# Patient Record
Sex: Male | Born: 1965 | Race: White | Hispanic: No | Marital: Married | Smoking: Former smoker
Health system: Southern US, Community
[De-identification: ages and names within clinical notes are randomized; demographics above are authoritative.]

## PROBLEM LIST (undated history)

## (undated) DIAGNOSIS — E119 Type 2 diabetes mellitus without complications: Secondary | ICD-10-CM

## (undated) HISTORY — PX: COLONOSCOPY: SHX174

## (undated) HISTORY — PX: NECK SURGERY: SHX720

## (undated) HISTORY — PX: POLYPECTOMY: SHX149

---

## 1999-01-30 ENCOUNTER — Ambulatory Visit (HOSPITAL_COMMUNITY): Admission: RE | Admit: 1999-01-30 | Discharge: 1999-01-30 | Payer: Self-pay | Admitting: Obstetrics and Gynecology

## 1999-02-25 ENCOUNTER — Encounter: Payer: Self-pay | Admitting: Neurosurgery

## 1999-02-26 ENCOUNTER — Encounter: Payer: Self-pay | Admitting: Neurosurgery

## 1999-02-26 ENCOUNTER — Inpatient Hospital Stay (HOSPITAL_COMMUNITY): Admission: RE | Admit: 1999-02-26 | Discharge: 1999-02-27 | Payer: Self-pay | Admitting: Neurosurgery

## 2004-10-22 ENCOUNTER — Ambulatory Visit (HOSPITAL_COMMUNITY): Admission: RE | Admit: 2004-10-22 | Discharge: 2004-10-22 | Payer: Self-pay | Admitting: Family Medicine

## 2006-04-13 ENCOUNTER — Ambulatory Visit (HOSPITAL_COMMUNITY): Admission: RE | Admit: 2006-04-13 | Discharge: 2006-04-13 | Payer: Self-pay | Admitting: Urology

## 2006-06-23 ENCOUNTER — Ambulatory Visit (HOSPITAL_COMMUNITY): Admission: RE | Admit: 2006-06-23 | Discharge: 2006-06-23 | Payer: Self-pay | Admitting: Urology

## 2006-06-23 ENCOUNTER — Inpatient Hospital Stay (HOSPITAL_COMMUNITY): Admission: EM | Admit: 2006-06-23 | Discharge: 2006-06-24 | Payer: Self-pay | Admitting: Emergency Medicine

## 2006-06-25 ENCOUNTER — Inpatient Hospital Stay (HOSPITAL_COMMUNITY): Admission: EM | Admit: 2006-06-25 | Discharge: 2006-06-27 | Payer: Self-pay | Admitting: Emergency Medicine

## 2006-07-07 ENCOUNTER — Ambulatory Visit (HOSPITAL_COMMUNITY): Admission: RE | Admit: 2006-07-07 | Discharge: 2006-07-07 | Payer: Self-pay | Admitting: Urology

## 2006-07-12 ENCOUNTER — Ambulatory Visit (HOSPITAL_COMMUNITY): Admission: RE | Admit: 2006-07-12 | Discharge: 2006-07-12 | Payer: Self-pay | Admitting: Urology

## 2006-08-12 ENCOUNTER — Ambulatory Visit (HOSPITAL_COMMUNITY): Admission: RE | Admit: 2006-08-12 | Discharge: 2006-08-12 | Payer: Self-pay | Admitting: Urology

## 2007-02-03 ENCOUNTER — Ambulatory Visit (HOSPITAL_COMMUNITY): Admission: RE | Admit: 2007-02-03 | Discharge: 2007-02-03 | Payer: Self-pay | Admitting: Urology

## 2008-02-07 IMAGING — CR DG ABDOMEN 1V
1 series · 1 of 1 positions shown · non-contrast
Comparison: 04/13/06.

CLINICAL DATA: Follow-up right renal calculus.
 ABDOMEN ? 1 VIEW:

[view not recorded]
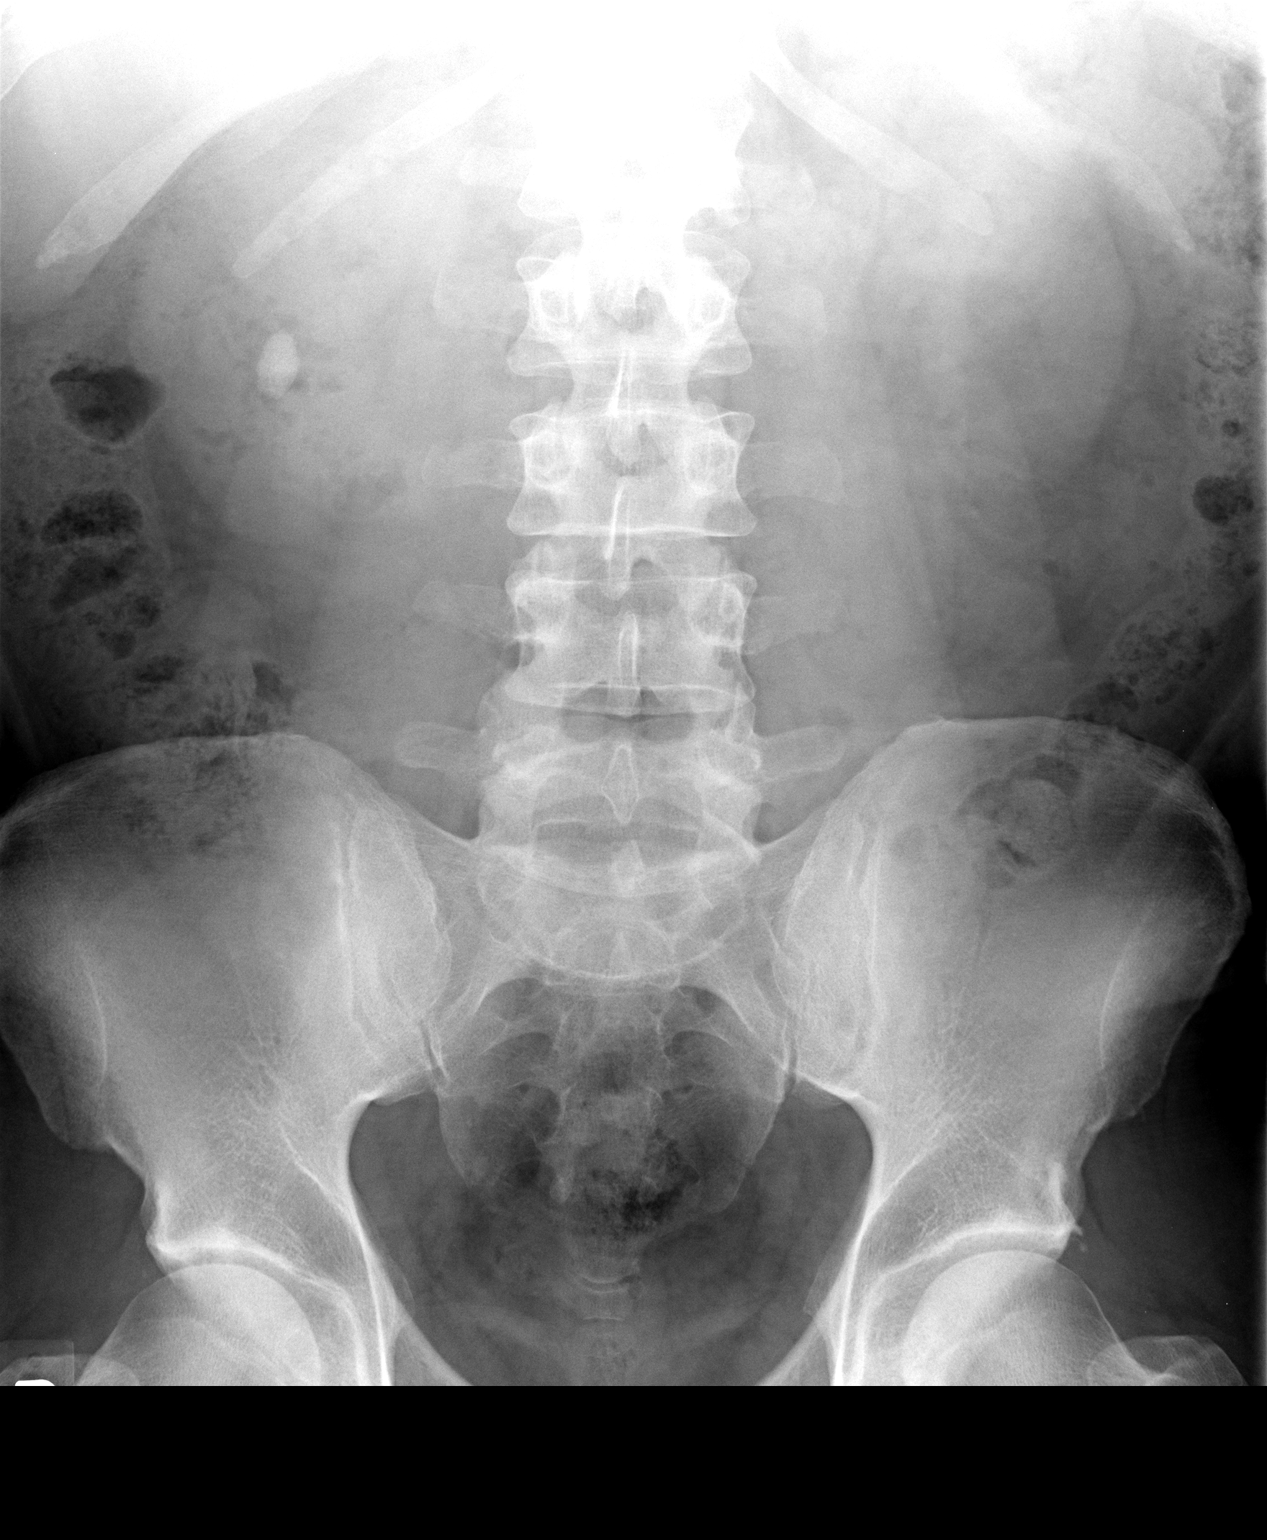

[1 of 1 positions shown; findings below may reference images not displayed]

FINDINGS: As seen on the prior exam, there is a 17.6 x 11.1 mm stone in the right renal lower pole collecting system.  This is unchanged compared with the prior exam.  No additional stones are identified.  The bowel gas pattern is normal.
IMPRESSION: No significant change in right renal calculus.

## 2010-02-10 ENCOUNTER — Ambulatory Visit: Payer: Self-pay | Admitting: Orthopedic Surgery

## 2010-02-10 DIAGNOSIS — M7710 Lateral epicondylitis, unspecified elbow: Secondary | ICD-10-CM | POA: Insufficient documentation

## 2010-06-04 ENCOUNTER — Ambulatory Visit: Payer: Self-pay | Admitting: Orthopedic Surgery

## 2010-06-05 ENCOUNTER — Encounter (INDEPENDENT_AMBULATORY_CARE_PROVIDER_SITE_OTHER): Payer: Self-pay | Admitting: *Deleted

## 2010-06-06 ENCOUNTER — Ambulatory Visit (HOSPITAL_COMMUNITY): Admission: RE | Admit: 2010-06-06 | Discharge: 2010-06-06 | Payer: Self-pay | Admitting: Orthopedic Surgery

## 2010-06-16 ENCOUNTER — Telehealth: Payer: Self-pay | Admitting: Orthopedic Surgery

## 2010-06-17 ENCOUNTER — Encounter: Payer: Self-pay | Admitting: Orthopedic Surgery

## 2010-06-30 ENCOUNTER — Encounter: Payer: Self-pay | Admitting: Orthopedic Surgery

## 2010-12-23 NOTE — Miscellaneous (Signed)
   The patient today regarding MRI MRI shows intrasubstance tear ECRB and severe lateral epicondylitis.  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

## 2010-12-23 NOTE — Letter (Signed)
Summary: *Orthopedic Consult Note  [facility]  [address]  [place], [place] [address]   Phone: [phone]  Fax: [phone]    Re:    Jeff Mccarthy DOB:    Nov 17, 1966   Dear: Jeff Mccarthy   Thank you for requesting that we see the above patient for consultation.  A copy of the detailed office note will be sent under separate cover, for your review.  Evaluation today is consistent with: posttraumatic lateral epicondylitis   Our recommendation is for: self-directed exercise program, cryotherapy, injection, tennis elbow brace.       Thank you for this opportunity to look after your patient.  Sincerely,   Terrance Mass. MD.

## 2010-12-23 NOTE — Letter (Signed)
Summary: [facility]  [address]  [place], [place] [address]   Phone: [phone]  Fax: [phone]    Today's Date: June 17, 2010  Name of Patient: Jeff Mccarthy   Please take the following into consideration:  Special Instructions:   [ X ] Other   Patient / Employee cannot take the PT test for 6 weeks.    Sincerely,   Terrance Mass, MD

## 2010-12-23 NOTE — Progress Notes (Signed)
Summary: call back from patient for MRI results  Phone Note Call from Patient   Caller: Patient Summary of Call: Patient called in, states he rec'd a call back from Dr Romeo Apple at [facility] ph# about MRI results.  States was out of town all last wk. Please call Cell PH # [phone] with results. Initial call taken by: Cammie Sickle,  June 16, 2010 11:27 AM

## 2010-12-23 NOTE — Assessment & Plan Note (Signed)
Summary: LEFT ELBOW HURTING AGAIN/XR HERE 02/10/10/[facility]   Visit Type:  Follow-up Referring Provider:  Dr. Regino Schultze Primary Provider:  [facility]  CC:  left elbow.  History of Present Illness: followup visit status post injured her LEFT elbow patient was also treated for traumatic injury to LEFT elbow as well as tennis elbow with Naprosyn, injection and tennis elbow brace  Initially got some relief from the brace the pain seems to be worsening over the lateral elbow  Pain never really went away completely  No new injury  not on  a medication right now  Initial injury was in March of 2011   initial history 45 year old male LEFT elbow pain after curling with a concentration curl maneuver.  No pop was felt but the next day had pain lateral elbow which is increased over a month.  It still sometime sharp is intermittent it is worse with power extension maneuvers somewhat relieved with ice and naproxen.  Pain level is a 6/10  The above injury was in March of this year.  The pain never went away.    Was given Naprosyn 500 two times a day, injection and tennis elbow brace, brace helped, injection did not help.       Allergies: No Known Drug Allergies  Past History:  Past Medical History: Last updated: 02/10/2010 na  Past Surgical History: Last updated: 02/10/2010 lasik bilateral eyes fusion of c5-c6  Physical Exam  Additional Exam:  General appearance was normal muscular build  ** CDV: normal pulses temperature and no edema  * LYMPH nodes were normal   * SKIN was normal   * Neuro: normal sensation ** Psyche: AAO x 3 and mood was normal   MSK *Gait was normal  inspection tenderness over the lateral elbow, normal range of motion elbow, stability normal, motor exam normal, provocative test positive pain with extension of the wrist pain referred to the lateral elbow   Impression & Recommendations:  Problem # 1:  LATERAL EPICONDYLITIS  (ICD-726.32) Assessment Deteriorated  previous treatments of nonoperative therapy including 2 injections one given today has not relieved his pain recommend MRI to confirm diagnosis and recommend further treatment  Orders: Est. Patient Level III (38756) Joint Aspirate / Injection, Intermediate (20605) Depo- Medrol 40mg  (J1030)  Medications Added to Medication List This Visit: 1)  Diclofenac Sodium 50 Mg Tbec (Diclofenac sodium) .... One by mouth bid  Patient Instructions: 1)  Change medication to diclofenac  2)  Continue brace  3)  Order MRI elbow  4)  Continue elbow exercises  Prescriptions: DICLOFENAC SODIUM 50 MG TBEC (DICLOFENAC SODIUM) one by mouth bid  #60 x 1   Entered and Authorized by:   Fuller Canada MD   Signed by:   Fuller Canada MD on 06/04/2010   Method used:   Faxed to ...       [facility]* (retail)       [address]       [place], [place]  [address]       Ph: [phone] or [phone]       Fax: [phone]   RxID:   [id number]

## 2010-12-23 NOTE — Letter (Signed)
Summary: History form  History form   Imported By: Jacklynn Ganong 02/12/2010 12:06:48  _____________________________________________________________________  External Attachment:    Type:   Image     Comment:   External Document

## 2010-12-23 NOTE — Assessment & Plan Note (Signed)
Summary: CONSULT/TREAT LT ELBOW/NEEDS XRAY/REF [facility]   Vital Signs:  Patient profile:   45 year old male Height:      70 inches Weight:      235 pounds Pulse rate:   74 / minute Resp:     16 per minute  Visit Type:  Initial Consult Referring Provider:  Dr. Regino Schultze Primary Provider:  [facility]  CC:  left elbow.  History of Present Illness: 45 year old male LEFT elbow pain after curling with a concentration curl maneuver.  No pop was felt but the next day had pain lateral elbow which is increased over a month.  It still sometime sharp is intermittent it is worse with power extension maneuvers somewhat relieved with ice and naproxen.  Pain level is a 6/10  Will have xrays today.  No meds.      Allergies (verified): No Known Drug Allergies  Past History:  Past Medical History: na  Past Surgical History: lasik bilateral eyes fusion of c5-c6  Family History: na  Social History: Patient is married.  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 Cardiovascular:  Denies chest pain, palpitations, fainting, and murmurs. Respiratory:  Denies short of breath, wheezing, couch, tightness, pain on inspiration, and snoring . Gastrointestinal:  Denies heartburn, nausea, vomiting, diarrhea, constipation, and blood in your stools. Genitourinary:  Denies frequency, urgency, difficulty urinating, painful urination, flank pain, and bleeding in urine. Neurologic:  Denies numbness, tingling, unsteady gait, dizziness, tremors, and seizure. Musculoskeletal:  Complains of joint pain and stiffness; denies swelling, instability, redness, heat, and muscle pain. Endocrine:  Denies excessive thirst, exessive urination, and heat or cold intolerance. Psychiatric:  Denies nervousness, depression, anxiety, and hallucinations. Skin:  Denies  changes in the skin, poor healing, rash, itching, and redness. HEENT:  Denies blurred or double vision, eye pain, redness, and watering. Immunology:  Denies seasonal allergies, sinus problems, and allergic to bee stings. Hemoatologic:  Denies easy bleeding and brusing.  Physical Exam  Additional Exam:  vital signs stable as recorded.  Gen. appearance normal.  Pulses in the LEFT arm normal no swelling  No lymph nodes LEFT arm  He's tender over the lateral epicondyle radial head.  Range of motion is full pain with extension.  Elbow stable.  Muscle strength normal.  Pain with extension against resistance.  kin normal.  Sensation normal.  Orientation to time person place normal.  Mood and affect normal.   Impression & Recommendations:  Problem # 1:  LATERAL EPICONDYLITIS (ICD-726.32) Assessment New  ordered LEFT elbow x-rays,AP lateral LEFT elbow  Bony anatomy is normal  Joint spaces are preserved  Impression normal elbow  Recommend injection LEFT elbow The  elbow was prepped with alcohol and anesthetized with ethyl chloride. 40 mg of Depo-Medrol and 5 cc of 1% lidocaine were injected along the lateral epicondyle. No complications. Verbal consent was obtained prior to injection.   Tennis elbow brace  Stretch and strengthening program for 6 weeks  Continue naproxen for 6 weeks  Orders: Consultation Level III (25366) Joint Aspirate / Injection, Intermediate (20605) Depo- Medrol 40mg  (J1030) Elbow x-ray, 2 views (44034)  Patient Instructions: 1)  Tennis elbow brace 2)  Stretch and strengthening program for 6 weeks 3)  Continue naproxen for 6 weeks 4)  Please schedule a follow-up appointment as needed. 5)  You have received an injection of cortisone today. You may experience increased pain  at the injection site. Apply ice pack to the area for 20 minutes every 2 hours and take 2 xtra strength tylenol every 8 hours. This increased pain will usually resolve in 24 hours. The  injection will take effect in 3-10 days.

## 2010-12-23 NOTE — Miscellaneous (Signed)
Summary: mri aph 06/06/10 reg 430pm  Clinical Lists Changes  o precert needed for [facility], advised pt of appt, dr H will call with results.cbt

## 2010-12-23 NOTE — Letter (Signed)
Summary: [facility]  [address]  [place], [place] [address]   Phone: [phone]  Fax: [phone]      Today's Date: June 30, 2010  Name of Patient: Jeff Mccarthy  Please take the following into consideration:   Special Instructions:  Due to medical reasons --  Patient / Employee cannot take the PT test for 6 weeks - through August 11, 2010. ________________________________________________________________________________________________________________________________________   Sincerely,   Terrance Mass, MD

## 2011-04-10 NOTE — H&P (Signed)
NAME:  Jeff Mccarthy, Jeff Mccarthy NO.:  [id number]   MEDICAL RECORD NO.:  [id number]          PATIENT TYPE:  AMB   LOCATION:  DAY                           FACILITY:  [facility]   PHYSICIAN:  Dennie Maizes, M.D.   DATE OF BIRTH:  1966/06/26   DATE OF ADMISSION:  06/23/2006  DATE OF DISCHARGE:  LH                                HISTORY & PHYSICAL   CHIEF COMPLAINT:  Right flank pain, large right renal calculus.   HISTORY OF PRESENT ILLNESS:  This 45 year old male is referred to me by Dr.  Regino Schultze.  He had intermittent right flank pain for a few months.  He was  evaluated with noncontrast CT scan of the abdomen and pelvis, as well as KUB  area.  This revealed a large stone, about 12 x 10 mm in size, in the right  renal pelvis.  Patient denied having any fever, chills, hematuria or voiding  difficulty.  He has urinary frequency times three to four and nocturia times  one to two.  There is no past history of urolithiasis.   PAST MEDICAL HISTORY:  Cervical spine surgery, fusion of C5-C6, sinus  problems.   MEDICATIONS:  Flonase and Clarinex.   ALLERGIES:  NONE.   EXAMINATION:  Height 5 feet 10, weight 231 pounds.  Head, eyes, ears, nose and throat normal.  NECK:  No masses.  LUNGS:  Clear to auscultation.  HEART:  Regular rate and rhythm, no murmurs.  ABDOMEN:  Soft, no palpable flank mass, mild right costovertebral angle  tenderness is noted.  Penis and testes are normal.   IMPRESSION:  Large right renal calculus with right flank pain.   PLAN:  I have discussed with the patient regarding the management options.  The right renal calculus is too large to pass.  I recommended lithotripsy of  the calculus and the patient is agreeable.  He is scheduled to undergo  extracorporeal shock wave lithotripsy of the right renal calculus with IV  sedation at [facility].  I discussed with the patient regarding the  diagnosis, operative details, alternative treatments, outcome,  possible  risks and complications, and he has agreed for the procedure to be done.      Dennie Maizes, M.D.  Electronically Signed     SK/MEDQ  D:  06/23/2006  T:  06/23/2006  Job:  [id number]   cc:   Kirk Ruths, M.D.  Fax: [phone]

## 2011-04-10 NOTE — Op Note (Signed)
NAME:  Jeff Mccarthy, OLD NO.:  [id number]   MEDICAL RECORD NO.:  [id number]          PATIENT TYPE:  INP   LOCATION:  A304                          FACILITY:  [facility]   PHYSICIAN:  Dennie Maizes, M.D.   DATE OF BIRTH:  [birth year]   DATE OF PROCEDURE:  06/24/2006  DATE OF DISCHARGE:                                 OPERATIVE REPORT   PREOPERATIVE DIAGNOSES:  Right ureteral calculi with obstruction, right  renal colic, post ESL right renal calculus.   POSTOPERATIVE DIAGNOSES:  Right ureteral calculi with obstruction, right  renal colic, post ESL right renal calculus.   OPERATIVE PROCEDURE:  Cystoscopy, right retrograde pyelogram and right  ureteral stent placement.   ANESTHESIA:  General.   SURGEON:  Dennie Maizes, M.D.   COMPLICATIONS:  None.   DRAINS:  6-French 26 cm size right ureteral stent.   SPECIMEN:  None.   INDICATIONS FOR PROCEDURE:  This 45 year old male had a large right renal  calculus.  He has undergone ESL of the right renal calculus on June 23, 2006. He developed severe right flank pain, nausea and vomiting in the  postoperative period.  He was admitted to the hospital for further  evaluation and management.  X-rays revealed large upper ureteral stone  fragments as well as lower ureteral stone fragments.  The patient was taken  to the operating room today for cystoscopy, right retrograde pyelogram and  right ureteral stent placement.   DESCRIPTION OF PROCEDURE:  General anesthesia was induced and the patient  was placed on the OR table in the dorsolithotomy position.  The lower  abdomen and genitalia were prepped and draped in a sterile fashion.  Cystoscopy was done with a 25-French scope.  The appearance of the urethra,  bladder and prostate were normal.  A 5-French  wedge  catheter was placed in  the right  ureteral orifice and retrograde pyelogram was done.  There were  large filling defects in the upper ureter.  There was also  hydronephrosis.   A 5-French open-ended catheter was then placed in the right ureteral  orifice.  A 0.038 inch Bentson guidewire with a flexible tip was then  advanced into the right renal pelvis without any difficulty.  The open-ended  catheter was then removed.  A 6-French 26 cm size stent was then inserted  into the right collecting system.  The cystoscope was removed.  The patient  was transferred to the PACU in satisfactory condition.      Dennie Maizes, M.D.  Electronically Signed     SK/MEDQ  D:  06/24/2006  T:  06/24/2006  Job:  [id number]   cc:   Kirk Ruths, M.D.  Fax: [phone]

## 2011-04-10 NOTE — H&P (Signed)
NAME:  MARLYN, RABINE NO.:  [id number]   MEDICAL RECORD NO.:  [id number]          PATIENT TYPE:  INP   LOCATION:  A325                          FACILITY:  [facility]   PHYSICIAN:  Ky Barban, M.D.DATE OF BIRTH:  1966/01/22   DATE OF ADMISSION:  06/25/2006  DATE OF DISCHARGE:  LH                                HISTORY & PHYSICAL   CHIEF COMPLAINT:  Right renal colic.   HISTORY:  A 45 year old gentleman had ESL done a couple of days ago for  right ureteral calculus.  Subsequently, he developed renal colic and a  double-J stent was placed by Dr. Rito Ehrlich yesterday.  He was doing fine and  started to have severe pain about 4 hours ago today.  He came to the  emergency room where he was medicated and pain was brought into control, but  he was still having pain, so we decided to admit him to control his pain.  No fever, chills, hematuria or any voiding difficulty.  He had some nausea  and vomiting.   PAST MEDICAL HISTORY:  1. History of having kidney stone in the past.  2. No diabetes or hypertension.   PERSONAL HISTORY:  Does not smoke or drink.   REVIEW OF SYMPTOMS:  Unremarkable.   EXAMINATION:  VITAL SIGNS:  Blood pressure is 125/80.  Temperature is  normal.  CENTRAL NERVOUS SYSTEM:  Negative.  HEAD, NECK AND ENT:  Negative.  CHEST:  Symmetrical.  HEART:  Regular sinus rhythm.  No murmur.  ABDOMEN:  Soft, flat.  Liver, spleen and kidneys are not palpable.  No CVA  tenderness.  RECTAL AND GENITALIA:  Unremarkable.  Rectal exam deferred.  EXTREMITIES:  Normal.   IMPRESSION:  Right renal colic.   PLAN:  IV fluids, parenteral analgesia and encourage oral fluid intake.  I  told him if he is feeling fine I will discharge him home in the morning.     Ky Barban, M.D.  Electronically Signed    MIJ/MEDQ  D:  06/25/2006  T:  06/26/2006  Job:  [id number]

## 2011-04-10 NOTE — H&P (Signed)
NAME:  Jeff Mccarthy, Jeff Mccarthy NO.:  [id number]   MEDICAL RECORD NO.:  [id number]          PATIENT TYPE:  AMB   LOCATION:  DAY                           FACILITY:  [facility]   PHYSICIAN:  Dennie Maizes, M.D.   DATE OF BIRTH:  [birth year]   DATE OF ADMISSION:  DATE OF DISCHARGE:  LH                                HISTORY & PHYSICAL   CHIEF COMPLAINT:  Right flank pain, right ureteral stone fragments, post  ESWL right renal calculus.   HISTORY OF PRESENT ILLNESS:  This 45 year old male was referred to me by Dr.  Regino Schultze.  He had intermittent right flank pain for a few months.  He was  evaluated with a noncontrast CT scan of the abdomen and pelvis as well as an  x-ray of the KUB area.  These x-rays revealed a large stone about 12 or 10  mm in size in the right renal pelvis.  The patient has undergone  extracorporeal shock wave lithotripsy of stone on June 23, 2006.  The stone  has fragmented well.  The patient was admitted to the hospital with severe  pain after the ESWL.  He had a large stone fragment in the proximal as well  as distal ureter.  He has undergone cystoscopy, right retrograde pyelogram,  and right ureteral stent placement.  The patient has good pain relief at  present.  He did not have any fever, chills, voiding difficulty, or gross  hematuria.  He is brought to the short stay center for cystoscopy, removal  of right ureteral stent, right retrograde pyelogram, ureteroscopy, stone  extraction, and stent placement.   PAST MEDICAL HISTORY:  1. Cervical spine surgery, fusion of C5-C6.  2. Sinus problem.  3. Status post ESWL of right renal calculus on July 12, 2006.   MEDICATIONS:  1. Flonase.  2. Clarinex.   ALLERGIES:  None.   EXAMINATION:  VITAL SIGNS:  Height 5 feet 10 inches, weight 231 pounds.  HEENT:  Normal.  NECK:  No masses.  LUNGS:  Clear to auscultation.  HEART:  Regular rate and rhythm.  No murmurs.  ABDOMEN:  Soft.  No palpable flank mass or  costovertebral angle tenderness.  Bladder not palpable.  GENITOURINARY:  Penis and testes are normal.   IMPRESSION:  Post extracorporeal shock wave lithotripsy right renal  calculus, right ureteral stone fragments, with obstruction.  Right renal  colic.   PLAN:  Cystoscopy, removal of right ureteral stent, right retrograde  pyelogram, ureteroscopy, stone extraction, and stent placement under  anesthesia in the short stay center.  I have discussed with the patient  regarding the diagnosis,  operative details, alternative treatments, outcome, possible risks, and  complications, and he has agreed for the procedure to be done.  Migration of  the stone fragments into the renal pelvis during the procedure was explained  to the patient.      Dennie Maizes, M.D.  Electronically Signed     SK/MEDQ  D:  07/11/2006  T:  07/11/2006  Job:  [id number]   cc:   Kirk Ruths, M.D.  Fax: [phone]

## 2011-04-10 NOTE — Op Note (Signed)
NAME:  Jeff Mccarthy, Jeff Mccarthy NO.:  [id number]   MEDICAL RECORD NO.:  [id number]          PATIENT TYPE:  AMB   LOCATION:  DAY                           FACILITY:  [facility]   PHYSICIAN:  Dennie Maizes, M.D.   DATE OF BIRTH:  11/10/1966   DATE OF PROCEDURE:  07/12/2006  DATE OF DISCHARGE:                                 OPERATIVE REPORT   PREOPERATIVE DIAGNOSIS:  Right upper ureteral calculi with obstruction,  status post right ureteral stent placement, post extracorporeal shock wave  lithotripsy right renal calculus.   POSTOPERATIVE DIAGNOSIS:  Right upper ureteral calculi with obstruction,  status post right ureteral stent placement, post extracorporeal shock wave  lithotripsy right renal calculus.   OPERATIVE PROCEDURE:  Cystoscopy, removal of right ureteral stent, right  retrograde pyelogram, right ureteroscopy, stone extraction, right ureteral  stent placement.   ANESTHESIA:  General.   SURGEON:  Dennie Maizes, M.D.   COMPLICATIONS:  None.   ESTIMATED:  Blood loss minimal.  Drains 6.26 cm size stent with a string in  the right ureter.   INDICATIONS FOR PROCEDURE:  This 45 year old male has undergone ESL of large  right renal calculus 2 weeks ago.  He was admitted to hospital with a large  right upper ureteral calculi with obstruction.  Cystoscopy, right ureteral  stent placement were done.  The patient was brought to the short-stay center  today for cystoscopy, right retrograde pyelogram, ureteroscopy, stone  extraction and stent placement.   DESCRIPTION OF PROCEDURE:  General anesthesia was induced and the patient  was placed on the OR table in the dorsal lithotomy position.  The lower  abdomen and genitalia were prepped and draped in a sterile fashion.  Cystoscopy was done with a 25-French scope.  Urethra and prostate were  normal.  There is lot of edema around the right ureteral orifice.  Right  ureteral stent was then removed with the biopsy forceps.   A 5-French French catheter was then placed in the right ureteral orifice.  About 7 mL of Renografin-60 was injected into the collecting system and a  retrograde pyelogram was done.  This revealed multiple large filling defects  in the upper ureter suggestive of stone fragments.  There was mild right  hydronephrosis.   Through an open ended catheter 0.038 inch Bentson guidewire was then  inserted in the right collecting system.  A 35 cm size ureteral access  sheath was inserted in the right ureter.  Safety guidewire was inserted into  the right renal pelvis.  Along the guidewire a 6 French flexible  ureteroscope was inserted into the right collecting system.  Several stone  fragments were seen in the upper ureter.  The largest stone fragment was  about 5 or 6 mm in size.  With a nitinol wire basket, the stone fragments  were trapped and retrieved.  One of the stone fragments moved up into the  renal pelvis and could not be retrieved.  There were also some stone  fragments in the distal ureter which were removed.  Ureteral access sheath  was then removed.  A 6 French 36 cm size 10 with a string was inserted in  the right collecting system.  The patient was then transferred to the PACU  in satisfactory condition.   A 60 was injected to the collecting system and retrograde pyelogram was  done.  This revealed multiple large filling defect in the upper ureter  suggestive of stone fragments.  There was mild right hydronephrosis.   A wire through open-ended catheter 0.138-gauge Bentson guidewire was then  inserted to the right collecting system.  The 35 cm size ureteral access  sheath was inserted to the right ureter.  A safety guidewire was then  inserted to the right renal pelvis.  Along the guidewire a 6-French flexible  ureteroscope was then inserted into the right collecting system.  Several  stone fragments were seen in the upper ureter.  The large stone fragment  measured about five a 6  mm in size.  The nitinol wire basket the stone  fragments were tract and retrieved.  Was stone fragments or duct into the  renal pelvis and could not be retrieved.  There also some stone fragment.  The distal ureter which were removed.  The ureteral access sheath was then  removed.  A 6.26 cm size stent with a string was inserted.  The right  collecting system.  The patient was then transferred to the PACU in a  satisfactory condition in a 6.26 cm size stent was inserted to the right  collecting system.  Copy to Dr. Molli Hazard in the dictation, dictated by fit  the patient thank you in the      Dennie Maizes, M.D.  Electronically Signed     SK/MEDQ  D:  07/12/2006  T:  07/12/2006  Job:  [id number]   cc:   Kirk Ruths, M.D.  Fax: [phone]

## 2011-04-10 NOTE — Discharge Summary (Signed)
NAME:  Jeff Mccarthy, PRIES NO.:  [id number]   MEDICAL RECORD NO.:  [id number]          PATIENT TYPE:  INP   LOCATION:  A325                          FACILITY:  [facility]   PHYSICIAN:  Ky Barban, M.D.DATE OF BIRTH:  [birth year]   DATE OF ADMISSION:  06/25/2006  DATE OF DISCHARGE:  08/05/2007LH                                 DISCHARGE SUMMARY   HISTORY:  [age]-year-old gentleman came to the emergency room with  right renal colic.  The patient had ESL done for right kidney stone a couple  of days ago and subsequently had developed renal colic.  Dr. Rito Ehrlich put a  double J stent in yesterday.  The patient was doing fine but started in  severe pain again.  He came to the emergency room and decided to admit him  for further management of pain.  He was started on IV fluid and parenteral  analgesia.   Lab workup showed WBC count was 8.8, hematocrit 45.6.  Sodium 140, potassium  3.8, chloride 100, CO2 32, glucose 109, BUN 13, creatinine 1.4.  Urine does  show positive nitrite.  Urine culture subsequently came back negative.  He  was started on IV fluids and parenteral analgesia and the pain became less,  but it took a couple of days before he felt comfortable enough to go home.  So, he was discharged home on August 5.  He will be followed up by Dr.  Rito Ehrlich in the office.   FINAL DISCHARGE DIAGNOSIS:  Right renal colic, post extracorporeal shock  wave lithotripsy and double J stent on that side.   I have told him if he has any pain or fever, to let me know.      Ky Barban, M.D.  Electronically Signed     MIJ/MEDQ  D:  07/14/2006  T:  07/15/2006  Job:  [id number]

## 2011-04-10 NOTE — H&P (Signed)
NAME:  Jeff Mccarthy, Jeff Mccarthy NO.:  [id number]   MEDICAL RECORD NO.:  [id number]          PATIENT TYPE:  INP   LOCATION:  A304                          FACILITY:  [facility]   PHYSICIAN:  Dennie Maizes, M.D.   DATE OF BIRTH:  [birth year]   DATE OF ADMISSION:  06/23/2006  DATE OF DISCHARGE:  LH                                HISTORY & PHYSICAL   CHIEF COMPLAINT:  Severe right flank pain, nausea, post ESL, large right  renal calculus.   HISTORY OF PRESENT ILLNESS:  This 45 year old male was referred to me by Dr.  Ewing Schlein.  He had right flank pain for a few months.  Evaluation revealed a  large right renal calculus, measuring about 12 x 10-mm in size.  This stone  is located in the right renal pelvis.  The patient has undergone ESL of the  right renal calculus on June 23, 2006.  He was discharged and sent home.  He experienced severe pain, which was not adequately controlled with p.o.  analgesics.  He also had intermittent nausea and vomiting.  He came to the  emergency room for further evaluation.  An x-ray of the area revealed good  fragmentation of the stone.  Multiple  stone fragments were noted in the  right kidney.  There were 5 or 6 stone fragments in the upper ureter, and a  small stone was noted in the right distal ureter.  The patient's pain was  not adequately controlled in the emergency room.  He was admitted to the  hospital for pain control and further treatment.   PAST MEDICAL HISTORY:  Cervical spine surgery, fusion of C5-6, sinus  problems, status post ESL of large right renal calculus on June 23, 2006.   MEDICATIONS:  Flonase and Clarinex, Cipro, Percocet.   ALLERGIES:  None.   PHYSICAL EXAMINATION:  GENERAL:  Height 5' 10, weight 231 pounds.  The  patient is in severe pain.  HEAD, EYES, EARS, NOSE, AND THROAT:  Normal.  NECK:  No masses.  LUNGS:  Clear to auscultation.  HEART:  Regular rate and rhythm.  No murmurs.  ABDOMEN:  Soft.  No palpable flank  mass.  Mild right costovertebral angle  tenderness is noted.  PENIS AND TESTES:  Normal.   IMPRESSION:  Post ESL, right renal calculus, right upper ureteral stone  fragments with obstruction, right renal colic.   PLAN:  Admit the patient to the hospital for IV fluids, treat pain with  narcotics, and antiemetics.  Discuss with the patient regarding further  treatment.  He may need cystoscopy, right retrograde pyelogram, and right  uretal stent placement.  I have discussed with the patient and his wife  regarding the diagnosis, operative details, alternative treatments,  outcomes, possible risks and complications, and he has agreed for the  procedure to be done.      Dennie Maizes, M.D.  Electronically Signed     SK/MEDQ  D:  06/24/2006  T:  06/24/2006  Job:  [id number]   cc:   Petra Kuba, M.D.  Fax: [phone]

## 2011-04-10 NOTE — Discharge Summary (Signed)
NAME:  Jeff Mccarthy, Jeff Mccarthy NO.:  [id number]   MEDICAL RECORD NO.:  [id number]          PATIENT TYPE:  INP   LOCATION:  A304                          FACILITY:  [facility]   PHYSICIAN:  Dennie Maizes, M.D.   DATE OF BIRTH:  07-03-1966   DATE OF ADMISSION:  06/23/2006  DATE OF DISCHARGE:  08/02/2007LH                                 DISCHARGE SUMMARY   FINAL DIAGNOSIS:  Large right renal calculus, post ESL, right ureteral stone  with obstruction, right renal colic.   OPERATIVE PROCEDURE:  Cystoscopy, retrograde pyelogram, right ureteral stent  placement done on 06/24/2006.  Complications none.   DISCHARGE SUMMARY:  This 45 year old male was referred to me by Dr.  Regino Schultze.  He had  right flank pain for a few months.  Evaluation revealed a  large right renal calculus measuring 12 by 10 mm in size.  The stone was  located by the right renal pelvis.  The patient had underwent  ESL right  renal calculus in August1,2007.  He was discharged and sent home.  He  experienced  severe pain which was not adequately controlled with p.o.  analgesics.  He also had intermittent nausea and vomiting.  He came back to  the emergency room for further evaluation.  An x-ray of the KUB area  revealed good fragmentation of the stone.  Multiple stone fragments were  noted in the right kidney.  There were 4, 5 or 6 stone fragments  in the  upper ureter,  a small stone fragment was noted in the right distal ureter.  The patient's pain was not adequately controlled in the emergency room.  He  was admitted to hospital for pain control and further treatment.   PAST MEDICAL HISTORY:  Cervical spine surgery, fusion of C5-C6, sinus  problems, status post ESL of large right renal calculus on August1, 2007.   MEDICATIONS:  Flonase, Clarinex, Cipro, and Percocet.   ALLERGIES:  None.   PHYSICAL EXAMINATION:  Examination height 5 feet 10 inches, weight 231  pounds.  The patient was noted to be in severe  pain.  Head, Eyes, Ears, Nose  and Throat: Normal.  Neck: No masses.  Lungs: Clear to auscultation.  Heart  regular rate and rhythm, no murmurs.  Abdomen soft, no palpable flank mass,  mild right costovertebral angle tenderness was noted.  Penis and testes are  normal.   ADMISSION LABS:  CBC:  WBC 9.9, hemoglobin 15.5, hematocrit 44.4,  BUN 12,  creatinine 1.2.  Electrolytes within normal range   COURSE IN THE HOSPITAL:  The patient was taken to the OR on 06/24/2006.  Cystoscopy, retrograde pyelogram and right ureteral stent placement were  done under general anesthesia.  The patient had good relief of pain after  the procedure.  He was voiding well.  He was discharged and sent home on  06/24/2006.  Advised to continue regular medications as well as the  previously prescribed antibiotics and pain pills.  An x-ray KUB will be  repeated before  arrangements will be made for cystoscopy and stent removal in about 2  weeks.  The patient was advised to call me for fever, chills, voiding difficulty,  gross hematuria or severe pain. The condition of the patient at the time of  discharge is stable.      Dennie Maizes, M.D.  Electronically Signed     SK/MEDQ  D:  07/05/2006  T:  07/05/2006  Job:  [id number]   cc:   Kirk Ruths, M.D.  Fax: [phone]

## 2013-03-12 ENCOUNTER — Emergency Department (INDEPENDENT_AMBULATORY_CARE_PROVIDER_SITE_OTHER)
Admission: EM | Admit: 2013-03-12 | Discharge: 2013-03-12 | Disposition: A | Discharge status: Home / Self Care | Payer: PRIVATE HEALTH INSURANCE | Source: Home / Self Care | Attending: Family Medicine | Admitting: Family Medicine

## 2013-03-12 ENCOUNTER — Encounter (HOSPITAL_COMMUNITY): Payer: Self-pay | Admitting: *Deleted

## 2013-03-12 DIAGNOSIS — K122 Cellulitis and abscess of mouth: Secondary | ICD-10-CM

## 2013-03-12 DIAGNOSIS — J40 Bronchitis, not specified as acute or chronic: Secondary | ICD-10-CM

## 2013-03-12 DIAGNOSIS — J019 Acute sinusitis, unspecified: Secondary | ICD-10-CM

## 2013-03-12 MED ORDER — CETIRIZINE-PSEUDOEPHEDRINE ER 5-120 MG PO TB12: 1.0000 | ORAL_TABLET | Freq: Two times a day (BID) | ORAL | Status: DC | PRN

## 2013-03-12 MED ORDER — ALBUTEROL SULFATE HFA 108 (90 BASE) MCG/ACT IN AERS: 1.0000 | INHALATION_SPRAY | Freq: Four times a day (QID) | RESPIRATORY_TRACT | Status: DC | PRN

## 2013-03-12 MED ORDER — AMOXICILLIN 500 MG PO CAPS: 500.0000 mg | ORAL_CAPSULE | Freq: Three times a day (TID) | ORAL | Status: DC

## 2013-03-12 MED ORDER — PREDNISONE 20 MG PO TABS: ORAL_TABLET | ORAL | Status: DC

## 2013-03-12 MED ORDER — GUAIFENESIN-CODEINE 100-10 MG/5ML PO SYRP: 5.0000 mL | ORAL_SOLUTION | Freq: Three times a day (TID) | ORAL | Status: DC | PRN

## 2013-03-12 NOTE — ED Provider Notes (Signed)
History     CSN: [id number]  Arrival date & time 03/12/13  1513   First MD Initiated Contact with Patient 03/12/13 1515      Chief Complaint  Patient presents with  . URI    (Consider location/radiation/quality/duration/timing/severity/associated sxs/prior treatment) HPI Comments: 47 year old smoker male with history of seasonal allergies. Here complaining of nasal congestion, rhinorrhea, fatigue, nonproductive cough and wheezing for about 3 days. Energy level improving but worsening sore throat today. No fever. Denies chest pain. No abdominal pain, headache nausea vomiting or diarrhea. No rashes.   History reviewed. No pertinent past medical history.  History reviewed. No pertinent past surgical history.  History reviewed. No pertinent family history.  History  Substance Use Topics  . Smoking status: Current Every Day Smoker  . Smokeless tobacco: Not on file  . Alcohol Use: No      Review of Systems  Constitutional: Positive for appetite change and fatigue. Negative for fever, chills and diaphoresis.  HENT: Positive for congestion, sore throat, rhinorrhea, sneezing and sinus pressure. Negative for neck pain and tinnitus.   Eyes: Negative for discharge.  Respiratory: Positive for cough and wheezing.   Cardiovascular: Negative for chest pain.  Gastrointestinal: Negative for nausea, vomiting, abdominal pain and diarrhea.  Neurological: Positive for headaches. Negative for dizziness.    Allergies  Review of patient's allergies indicates no known allergies.  Home Medications   Current Outpatient Rx  Name  Route  Sig  Dispense  Refill  . albuterol (PROVENTIL HFA;VENTOLIN HFA) 108 (90 BASE) MCG/ACT inhaler   Inhalation   Inhale 1-2 puffs into the lungs every 6 (six) hours as needed for wheezing.   1 Inhaler   0   . amoxicillin (AMOXIL) 500 MG capsule   Oral   Take 1 capsule (500 mg total) by mouth 3 (three) times daily.   21 capsule   0   .  cetirizine-pseudoephedrine (ZYRTEC-D) 5-120 MG per tablet   Oral   Take 1 tablet by mouth 2 (two) times daily as needed for allergies or rhinitis.   30 tablet   0   . guaiFENesin-codeine (ROBITUSSIN AC) 100-10 MG/5ML syrup   Oral   Take 5 mLs by mouth 3 (three) times daily as needed for cough.   120 mL   0   . predniSONE (DELTASONE) 20 MG tablet      2 tabs by mouth daily for 5 days   10 tablet   0     BP 131/90  Pulse 97  Temp(Src) 98.1 F (36.7 C) (Oral)  Resp 18  SpO2 97%  Physical Exam  Nursing note and vitals reviewed. Constitutional: He is oriented to person, place, and time. He appears well-developed and well-nourished. No distress.  HENT:  Head: Normocephalic and atraumatic.  Nasal Congestion with erythema and swelling of nasal turbinates, clear rhinorrhea. Significant pharyngeal erythema there are ulcerationwith thin exudates over uvula and soft palate. No uvula deviation. No trismus. TM's with increased vascular markings and some dullness bilaterally no swelling or bulging  Eyes: Conjunctivae and EOM are normal. Pupils are equal, round, and reactive to light. Right eye exhibits no discharge. Left eye exhibits no discharge.  Neck: Neck supple. No JVD present.  Cardiovascular: Normal rate, regular rhythm and normal heart sounds.   Pulmonary/Chest: No respiratory distress. He has no wheezes. He has no rales. He exhibits no tenderness.  Bronchitic cough. Bilateral expiratory rhonchi.   Lymphadenopathy:    He has no cervical adenopathy.  Neurological: He is alert  and oriented to person, place, and time.  Skin: No rash noted. He is not diaphoretic.    ED Course  Procedures (including critical care time)  Labs Reviewed  POCT RAPID STREP A ([facility]RG CARE ONLY)   No results found.   1. Bronchitis   2. Acute rhinosinusitis   3. Uvulitis       MDM  Treated with prednisone, albuterol, cetirizine/pseudoephedrine , with adenosine/codeine and provided a hold  prescription for amoxicillin. Supportive care and red flags should prompt his return to medical attention discussed with patient and provided in writing.         Sharin Grave, MD 03/14/13 [phone]

## 2013-03-12 NOTE — ED Notes (Signed)
Pt  Reports  Symptoms  Of    Nasal    Sinus  Congestion              As  Well  As   sorethroat        Which  Developed  About  3  Days  Ago       -    Pt        Has  A   Cough  As  Well  -  The  Pt  Is  Awake  As  Well  As  Alert and  Oriented     Pt  Is  A  Smoker

## 2013-04-10 ENCOUNTER — Other Ambulatory Visit: Payer: Self-pay | Admitting: Orthopedic Surgery

## 2013-04-10 ENCOUNTER — Ambulatory Visit (HOSPITAL_COMMUNITY)
Admission: RE | Admit: 2013-04-10 | Discharge: 2013-04-10 | Disposition: A | Discharge status: Home / Self Care | Payer: PRIVATE HEALTH INSURANCE | Source: Ambulatory Visit | Attending: Orthopedic Surgery | Admitting: Orthopedic Surgery

## 2013-04-10 DIAGNOSIS — M25561 Pain in right knee: Secondary | ICD-10-CM

## 2013-04-10 DIAGNOSIS — M25569 Pain in unspecified knee: Secondary | ICD-10-CM | POA: Insufficient documentation

## 2013-04-12 ENCOUNTER — Encounter: Payer: Self-pay | Admitting: Orthopedic Surgery

## 2013-04-12 ENCOUNTER — Ambulatory Visit (INDEPENDENT_AMBULATORY_CARE_PROVIDER_SITE_OTHER): Payer: PRIVATE HEALTH INSURANCE | Admitting: Orthopedic Surgery

## 2013-04-12 VITALS — BP 120/86 | Ht 70.0 in | Wt 245.0 lb

## 2013-04-12 DIAGNOSIS — M7050 Other bursitis of knee, unspecified knee: Secondary | ICD-10-CM

## 2013-04-12 DIAGNOSIS — IMO0002 Reserved for concepts with insufficient information to code with codable children: Secondary | ICD-10-CM

## 2013-04-12 NOTE — Patient Instructions (Addendum)
You have received a steroid shot. 15% of patients experience increased pain at the injection site with in the next 24 hours. This is best treated with ice and tylenol extra strength 2 tabs every 8 hours. If you are still having pain please call the office.   Ice x 7-10 days   Naprosyn x 10 days   Bursitis Bursitis is a swelling and soreness (inflammation) of a fluid-filled sac (bursa) that overlies and protects a joint. It can be caused by injury, overuse of the joint, arthritis or infection. The joints most likely to be affected are the elbows, shoulders, hips and knees. HOME CARE INSTRUCTIONS   Apply ice to the affected area for 15-20 minutes each hour while awake for 2 days. Put the ice in a plastic bag and place a towel between the bag of ice and your skin.  Rest the injured joint as much as possible, but continue to put the joint through a full range of motion, 4 times per day. (The shoulder joint especially becomes rapidly "frozen" if not used.) When the pain lessens, begin normal slow movements and usual activities.  Only take over-the-counter or prescription medicines for pain, discomfort or fever as directed by your caregiver.  Your caregiver may recommend draining the bursa and injecting medicine into the bursa. This may help the healing process.  Follow all instructions for follow-up with your caregiver. This includes any orthopedic referrals, physical therapy and rehabilitation. Any delay in obtaining necessary care could result in a delay or failure of the bursitis to heal and chronic pain. SEEK IMMEDIATE MEDICAL CARE IF:   Your pain increases even during treatment.  You develop an oral temperature above 102 F (38.9 C) and have heat and inflammation over the involved bursa. MAKE SURE YOU:   Understand these instructions.  Will watch your condition.  Will get help right away if you are not doing well or get worse. Document Released: 11/06/2000 Document Revised: 02/01/2012  Document Reviewed: 10/11/2009 [facility] Patient Information 2014 [place], [place].

## 2013-04-12 NOTE — Progress Notes (Signed)
Patient ID: Jeff Mccarthy, male   DOB: [birth year], 47 y.o.   MRN: [id number] Chief Complaint  Patient presents with  . Knee Pain    Right knee pain, no injury     47 year old male with 2 weeks of medial right knee pain with no trauma. Denies catching locking or giving way. Pain response to Naprosyn. Pain came on gradually it is described as dull 7/10 tends to come and go worse with exercise  Seasonal allergies are recorded otherwise review of systems negative  Has no known allergies  He's had a C6-7 cervical fusion  His only medication listed is Naprosyn  He is a Scientist, research (medical) he does smoke a half-pack per day he drinks socially no drugs has a Scientist, water quality degree and is married  BP 120/86  Ht 5\' 10"  (1.778 m)  Wt 245 lb (111.131 kg)  BMI 35.15 kg/m2 General appearance is normal, the patient is alert and oriented x3 with normal mood and affect. His ventilating well without limp he does have tenderness over the medial bursa of the knee range of motion is normal stability of the knee normal motor strength normal skin normal pulse normal sensation normal  X-rays are normal  Bursitis recommend injection continue Naprosyn use ice followup as needed right  No diagnosis found.  Knee  Injection Procedure Note  Pre-operative Diagnosis: left knee bursitis  Post-operative Diagnosis: same  Indications: pain  Anesthesia: ethyl chloride   Procedure Details   Verbal consent was obtained for the procedure. Time out was completed.The burs  was prepped with alcohol, followed by  Ethyl chloride spray and A 25 gauge needle was inserted into the pes bursa via lateral approach; 4ml 1% lidocaine and 1 ml of depomedrol  was then injected into the joint . The needle was removed and the area cleansed and dressed.  Complications:  None; patient tolerated the procedure well.

## 2014-07-31 ENCOUNTER — Encounter: Payer: Self-pay | Admitting: Family Medicine

## 2014-07-31 ENCOUNTER — Ambulatory Visit (INDEPENDENT_AMBULATORY_CARE_PROVIDER_SITE_OTHER): Payer: PRIVATE HEALTH INSURANCE | Admitting: Family Medicine

## 2014-07-31 VITALS — BP 144/98 | Ht 70.0 in | Wt 259.0 lb

## 2014-07-31 DIAGNOSIS — J329 Chronic sinusitis, unspecified: Secondary | ICD-10-CM

## 2014-07-31 MED ORDER — AMOXICILLIN-POT CLAVULANATE 875-125 MG PO TABS: 1.0000 | ORAL_TABLET | Freq: Two times a day (BID) | ORAL | Status: AC

## 2014-07-31 NOTE — Progress Notes (Signed)
   Subjective:    Patient ID: Jeff Mccarthy, male    DOB: 1966/10/20, 47 y.o.   MRN: [id number]  Sore Throat  This is a new problem. The current episode started 1 to 4 weeks ago. Associated symptoms include ear pain and headaches. Treatments tried: otc decognestants.    Throat disc  No hx of walking pneum  Hx of inhasler use for wheezing in the past--didn't really need it    Right ear sat morning  Diminished energy    Review of Systems  HENT: Positive for ear pain.   Neurological: Positive for headaches.       Objective:   Physical Exam  Alert moderate malaise. Frontal maxillary tenderness. Nasal congestion. Pharynx slight erythema neck supple. Lungs clear. Heart rare in rhythm.      Assessment & Plan:  Impression acute rhinosinusitis plan antibiotics prescribed. Symptomatic care discussed. WSL

## 2014-07-31 NOTE — Patient Instructions (Signed)
Take a daily antihist, and then add sudafed or dimetapp as needed

## 2014-11-27 ENCOUNTER — Encounter: Payer: Self-pay | Admitting: Family Medicine

## 2014-11-27 ENCOUNTER — Ambulatory Visit (INDEPENDENT_AMBULATORY_CARE_PROVIDER_SITE_OTHER): Payer: PRIVATE HEALTH INSURANCE | Admitting: Family Medicine

## 2014-11-27 VITALS — BP 132/86 | Ht 70.0 in | Wt 248.0 lb

## 2014-11-27 DIAGNOSIS — E669 Obesity, unspecified: Secondary | ICD-10-CM

## 2014-11-27 DIAGNOSIS — Z0000 Encounter for general adult medical examination without abnormal findings: Secondary | ICD-10-CM

## 2014-11-27 DIAGNOSIS — R7301 Impaired fasting glucose: Secondary | ICD-10-CM

## 2014-11-27 DIAGNOSIS — E785 Hyperlipidemia, unspecified: Secondary | ICD-10-CM

## 2014-11-27 MED ORDER — KETOCONAZOLE 2 % EX CREA: 1.0000 "application " | TOPICAL_CREAM | Freq: Two times a day (BID) | CUTANEOUS | Status: DC

## 2014-11-27 NOTE — Progress Notes (Signed)
   Subjective:     Patient ID: Jeff Mccarthy, male    DOB: 15-Mar-1966, 49 y.o.   MRN: [id number]  HPI The patient comes in today for a wellness visit.  No fam hx of colon ca and no hx of prstate ca   A review of their health history was completed.  A review of medications was also completed.  Any needed refills; no  Eating habits: could be better, but trying  Falls/  MVA accidents in past few months: no  Regular exercise: not enough, not as much as he should, hard fitting it into the schedule.  Just quit smoking  BPs tend to run 82 or syst 134 with bp numbers  City blood work  Was told that his bp was high with a machine, sugar was good at 101, fasting A1C a tad high, and lipid panel was   Specialist pt sees on regular basis: no  Preventative health issues were discussed.   Additional concerns: would like to ask dr private question,  Mo passed away last month,    Review of Systems  Constitutional: Negative for fever, activity change and appetite change.  HENT: Negative for congestion and rhinorrhea.   Eyes: Negative for discharge.  Respiratory: Negative for cough and wheezing.   Cardiovascular: Negative for chest pain.  Gastrointestinal: Negative for vomiting, abdominal pain and blood in stool.  Genitourinary: Negative for frequency and difficulty urinating.  Musculoskeletal: Negative for neck pain.  Skin: Negative for rash.       Patient notes rash and irritation on the glans of his penis  Allergic/Immunologic: Negative for environmental allergies and food allergies.  Neurological: Negative for weakness and headaches.  Psychiatric/Behavioral: Negative for agitation.  All other systems reviewed and are negative.      Objective:   Physical Exam  Constitutional: He appears well-developed and well-nourished.  Obesity present  HENT:  Head: Normocephalic and atraumatic.  Right Ear: External ear normal.  Left Ear: External ear normal.  Nose: Nose normal.    Mouth/Throat: Oropharynx is clear and moist.  Eyes: EOM are normal. Pupils are equal, round, and reactive to light.  Neck: Normal range of motion. Neck supple. No thyromegaly present.  Cardiovascular: Normal rate, regular rhythm and normal heart sounds.   No murmur heard. Pulmonary/Chest: Effort normal and breath sounds normal. No respiratory distress. He has no wheezes.  Abdominal: Soft. Bowel sounds are normal. He exhibits no distension and no mass. There is no tenderness.  Genitourinary: Prostate normal and penis normal.  Proximal glans of penis reveals mild erythema with a flat smooth plaque-like rash  Musculoskeletal: Normal range of motion. He exhibits no edema.  Lymphadenopathy:    He has no cervical adenopathy.  Neurological: He is alert. He exhibits normal muscle tone.  Skin: Skin is warm and dry. No erythema.  Psychiatric: He has a normal mood and affect. His behavior is normal. Judgment normal.  Vitals reviewed.         Assessment & Plan:  Impression 1 wellness exam #2 balance otitis with secondary rash likely yeast discussed #3 obesity discussed #4 borderline glucose and lipid values per blood work. Plan patient encouraged to bring blood work in. Diet and exercise discussed in encourage. Ketoconazole cream twice a day to affected area. Encouraged to remain off cigarettes WSL

## 2014-12-02 DIAGNOSIS — R7301 Impaired fasting glucose: Secondary | ICD-10-CM | POA: Insufficient documentation

## 2014-12-02 DIAGNOSIS — E669 Obesity, unspecified: Secondary | ICD-10-CM | POA: Insufficient documentation

## 2014-12-02 DIAGNOSIS — E785 Hyperlipidemia, unspecified: Secondary | ICD-10-CM | POA: Insufficient documentation

## 2015-04-15 ENCOUNTER — Encounter: Payer: Self-pay | Admitting: Family Medicine

## 2015-04-15 ENCOUNTER — Ambulatory Visit (INDEPENDENT_AMBULATORY_CARE_PROVIDER_SITE_OTHER): Payer: PRIVATE HEALTH INSURANCE | Admitting: Family Medicine

## 2015-04-15 VITALS — BP 128/90 | Temp 98.4°F | Ht 70.0 in | Wt 252.0 lb

## 2015-04-15 DIAGNOSIS — J329 Chronic sinusitis, unspecified: Secondary | ICD-10-CM

## 2015-04-15 MED ORDER — CLARITHROMYCIN 500 MG PO TABS: 500.0000 mg | ORAL_TABLET | Freq: Two times a day (BID) | ORAL | Status: DC

## 2015-04-15 NOTE — Progress Notes (Signed)
   Subjective:    Patient ID: Jeff Mccarthy, male    DOB: Sep 10, 1966, 49 y.o.   MRN: [id number]  Cough This is a new problem. Episode onset: 3 days ago. Associated symptoms include ear pain, a fever, headaches, nasal congestion and a sore throat. Treatments tried: mucinex, otc decognest.   Cough cong  Low grade fever, dim energy  Felt warm sweat    Non productive guaf prn       Review of Systems  Constitutional: Positive for fever.  HENT: Positive for ear pain and sore throat.   Respiratory: Positive for cough.   Neurological: Positive for headaches.       Objective:   Physical Exam Alert moderate malaise final stable HET moderate his congestion frontal tenderness pharynx erythematous neck supple lungs clear heart regular in rhythm.       Assessment & Plan:  Impression acute rhinosinusitis plan antibiotics prescribed. Symptom care discussed. Warning signs discussed. Patient brings in blood work also which reveals he has early diabetes. To reschedule chronic visit later this week WSL

## 2015-04-17 ENCOUNTER — Telehealth: Payer: Self-pay | Admitting: Family Medicine

## 2015-04-17 MED ORDER — CEFPROZIL 500 MG PO TABS
500.0000 mg | ORAL_TABLET | Freq: Two times a day (BID) | ORAL | Status: DC
Start: 2015-04-17 — End: 2015-04-24

## 2015-04-17 NOTE — Telephone Encounter (Signed)
Patient said that the clarithromycin (BIAXIN) 500 MG tablet is tearing his stomach up and wants to know if we can change the medication to something else? Seen on 04/15/2015 for rhinosinusitis. Fithian

## 2015-04-17 NOTE — Telephone Encounter (Signed)
Per Dr. Nicki Reaper- Cefzil 500 mg BID x 10 days. Patient was notified. Med sent to pharmacy.

## 2015-04-18 ENCOUNTER — Encounter: Payer: Self-pay | Admitting: Family Medicine

## 2015-04-18 ENCOUNTER — Ambulatory Visit (INDEPENDENT_AMBULATORY_CARE_PROVIDER_SITE_OTHER): Payer: PRIVATE HEALTH INSURANCE | Admitting: Family Medicine

## 2015-04-18 VITALS — BP 128/86 | Ht 70.0 in | Wt 210.0 lb

## 2015-04-18 DIAGNOSIS — Z79899 Other long term (current) drug therapy: Secondary | ICD-10-CM | POA: Diagnosis not present

## 2015-04-18 DIAGNOSIS — E785 Hyperlipidemia, unspecified: Secondary | ICD-10-CM

## 2015-04-18 DIAGNOSIS — R748 Abnormal levels of other serum enzymes: Secondary | ICD-10-CM

## 2015-04-18 DIAGNOSIS — E119 Type 2 diabetes mellitus without complications: Secondary | ICD-10-CM

## 2015-04-18 NOTE — Patient Instructions (Addendum)
Yearly eye exams a good idea  We will rx glucometer  Need fasting sugar two to three times per wk  Need to exercise working at least up to three to four times per wk.  Fifty per cent cardio good brisk walk  Core muscle work the rest   If loss of ten to fifteen pounds of fat will help tremendously  BP criteria and lipid pane l criteria that are listed for diabetes patients are more strict and therefore we are quicker to go to medicine than not     Diabetes Mellitus and Food It is important for you to manage your blood sugar (glucose) level. Your blood glucose level can be greatly affected by what you eat. Eating healthier foods in the appropriate amounts throughout the day at about the same time each day will help you control your blood glucose level. It can also help slow or prevent worsening of your diabetes mellitus. Healthy eating may even help you improve the level of your blood pressure and reach or maintain a healthy weight.  HOW CAN FOOD AFFECT ME? Carbohydrates Carbohydrates affect your blood glucose level more than any other type of food. Your dietitian will help you determine how many carbohydrates to eat at each meal and teach you how to count carbohydrates. Counting carbohydrates is important to keep your blood glucose at a healthy level, especially if you are using insulin or taking certain medicines for diabetes mellitus. Alcohol Alcohol can cause sudden decreases in blood glucose (hypoglycemia), especially if you use insulin or take certain medicines for diabetes mellitus. Hypoglycemia can be a life-threatening condition. Symptoms of hypoglycemia (sleepiness, dizziness, and disorientation) are similar to symptoms of having too much alcohol.  If your health care provider has given you approval to drink alcohol, do so in moderation and use the following guidelines:  Women should not have more than one drink per day, and men should not have more than two drinks per day. One  drink is equal to:  12 oz of beer.  5 oz of wine.  1 oz of hard liquor.  Do not drink on an empty stomach.  Keep yourself hydrated. Have water, diet soda, or unsweetened iced tea.  Regular soda, juice, and other mixers might contain a lot of carbohydrates and should be counted. WHAT FOODS ARE NOT RECOMMENDED? As you make food choices, it is important to remember that all foods are not the same. Some foods have fewer nutrients per serving than other foods, even though they might have the same number of calories or carbohydrates. It is difficult to get your body what it needs when you eat foods with fewer nutrients. Examples of foods that you should avoid that are high in calories and carbohydrates but low in nutrients include:  Trans fats (most processed foods list trans fats on the Nutrition Facts label).  Regular soda.  Juice.  Candy.  Sweets, such as cake, pie, doughnuts, and cookies.  Fried foods. WHAT FOODS CAN I EAT? Have nutrient-rich foods, which will nourish your body and keep you healthy. The food you should eat also will depend on several factors, including:  The calories you need.  The medicines you take.  Your weight.  Your blood glucose level.  Your blood pressure level.  Your cholesterol level. You also should eat a variety of foods, including:  Protein, such as meat, poultry, fish, tofu, nuts, and seeds (lean animal proteins are best).  Fruits.  Vegetables.  Dairy products, such as milk, cheese,  and yogurt (low fat is best).  Breads, grains, pasta, cereal, rice, and beans.  Fats such as olive oil, trans fat-free margarine, canola oil, avocado, and olives. DOES EVERYONE WITH DIABETES MELLITUS HAVE THE SAME MEAL PLAN? Because every person with diabetes mellitus is different, there is not one meal plan that works for everyone. It is very important that you meet with a dietitian who will help you create a meal plan that is just right for  you. Document Released: 08/06/2005 Document Revised: 11/14/2013 Document Reviewed: 10/06/2013 [facility] Patient Information 2015 [place], [place]. This information is not intended to replace advice given to you by your health care provider. Make sure you discuss any questions you have with your health care provider.

## 2015-04-18 NOTE — Progress Notes (Signed)
   Subjective:    Patient ID: Jeff Mccarthy, male    DOB: 03/31/1966, 49 y.o.   MRN: [id number]  Diabetes He presents for his initial diabetic visit. He has type 2 diabetes mellitus.  A1C was 8.0 on bloodwork. Pt has been cutting out sugar and starches in diet. Pt has been doing some walking.  Group diabetes class handout given.   Pt also concerned about elevated liver enzymes on bloodwork. Pt has a copy of bloodwork.   Hx of hepatitis yrs go  Rare alcholo teo beers per month   Not exercising very much these days.  occas urine  Not very often with the eye doc  Had lasik surg 2002   Last year had blood work which showed no elevated liver enzymes. Patient gives history of hepatitis as a young child. No residual liver damage.    Review of Systems No abdominal pain no headache no chest pain no change in bowel habits no frequent urination no excess thirst    Objective:   Physical Exam  Alert vitals stable HEENT normal. Lungs clear. Heart regular in rhythm. Abdomen benign. liver. No tenderness ankles without edema      Assessment & Plan:  Impression 1 type 2 diabetes discussed at great length. A1c 8.0 #2 elevated liver enzymes discussed are 3 hyperlipidemia discussed plan of glucometer prescribed. Diet exercise discussed. Yearly eye doctor visit. Recheck in several months. Blood work then. Further recommendations based on results then. No specific prescription medicines this time. Hospital educational session strongly encouraged WSL

## 2015-04-20 DIAGNOSIS — E118 Type 2 diabetes mellitus with unspecified complications: Secondary | ICD-10-CM | POA: Insufficient documentation

## 2015-04-20 DIAGNOSIS — E119 Type 2 diabetes mellitus without complications: Secondary | ICD-10-CM | POA: Insufficient documentation

## 2015-04-20 DIAGNOSIS — R748 Abnormal levels of other serum enzymes: Secondary | ICD-10-CM | POA: Insufficient documentation

## 2015-04-24 ENCOUNTER — Telehealth: Payer: Self-pay | Admitting: Family Medicine

## 2015-04-24 DIAGNOSIS — E119 Type 2 diabetes mellitus without complications: Secondary | ICD-10-CM

## 2015-04-24 MED ORDER — CEFPROZIL 500 MG PO TABS: 500.0000 mg | ORAL_TABLET | Freq: Two times a day (BID) | ORAL | Status: DC

## 2015-04-24 MED ORDER — BLOOD GLUCOSE MONITOR KIT: PACK | Status: DC

## 2015-04-24 NOTE — Telephone Encounter (Signed)
Pt called stating that he was suppose to get a prescription for a glucose meter the last time he was here and did not receive one. Also pt went to the beach this weekend and left his antibiotics behind and wants to know if he can get a prescription for more.

## 2015-04-24 NOTE — Telephone Encounter (Signed)
On cefzil

## 2015-04-24 NOTE — Telephone Encounter (Signed)
Rep abx,  Glucometer plus strips

## 2015-04-24 NOTE — Telephone Encounter (Signed)
Rx sent electronically to pharmacy. Patient notified. 

## 2015-05-17 LAB — HM DIABETES EYE EXAM

## 2015-06-17 ENCOUNTER — Telehealth: Payer: Self-pay | Admitting: Family Medicine

## 2015-06-17 ENCOUNTER — Other Ambulatory Visit: Payer: Self-pay | Admitting: Family Medicine

## 2015-06-17 NOTE — Telephone Encounter (Signed)
rx called into [facility].

## 2015-06-17 NOTE — Telephone Encounter (Signed)
Patient needs refills on test strips called into [facility].

## 2015-07-16 LAB — HEPATIC FUNCTION PANEL
ALBUMIN: 4.8 g/dL (ref 3.5–5.5)
ALK PHOS: 54 IU/L (ref 39–117)
ALT: 16 IU/L (ref 0–44)
AST: 13 IU/L (ref 0–40)
BILIRUBIN TOTAL: 0.6 mg/dL (ref 0.0–1.2)
BILIRUBIN, DIRECT: 0.18 mg/dL (ref 0.00–0.40)
TOTAL PROTEIN: 7.2 g/dL (ref 6.0–8.5)

## 2015-07-16 LAB — LIPID PANEL
CHOLESTEROL TOTAL: 201 mg/dL — AB (ref 100–199)
Chol/HDL Ratio: 4.9 ratio units (ref 0.0–5.0)
HDL: 41 mg/dL (ref >39)
LDL Calculated: 136 mg/dL — ABNORMAL HIGH (ref 0–99)
Triglycerides: 120 mg/dL (ref 0–149)
VLDL Cholesterol Cal: 24 mg/dL (ref 5–40)

## 2015-07-16 LAB — HEMOGLOBIN A1C
ESTIMATED AVERAGE GLUCOSE: 117 mg/dL
Hgb A1c MFr Bld: 5.7 % — ABNORMAL HIGH (ref 4.8–5.6)

## 2015-07-17 ENCOUNTER — Ambulatory Visit (INDEPENDENT_AMBULATORY_CARE_PROVIDER_SITE_OTHER): Payer: PRIVATE HEALTH INSURANCE | Admitting: Family Medicine

## 2015-07-17 ENCOUNTER — Encounter: Payer: Self-pay | Admitting: Family Medicine

## 2015-07-17 VITALS — BP 122/74 | Ht 71.0 in | Wt 217.0 lb

## 2015-07-17 DIAGNOSIS — N489 Disorder of penis, unspecified: Secondary | ICD-10-CM | POA: Diagnosis not present

## 2015-07-17 DIAGNOSIS — R748 Abnormal levels of other serum enzymes: Secondary | ICD-10-CM | POA: Diagnosis not present

## 2015-07-17 DIAGNOSIS — E119 Type 2 diabetes mellitus without complications: Secondary | ICD-10-CM

## 2015-07-17 DIAGNOSIS — E785 Hyperlipidemia, unspecified: Secondary | ICD-10-CM | POA: Diagnosis not present

## 2015-07-17 DIAGNOSIS — R21 Rash and other nonspecific skin eruption: Secondary | ICD-10-CM

## 2015-07-17 NOTE — Progress Notes (Signed)
   Subjective:    Patient ID: Jeff Mccarthy, male    DOB: 02/06/1966, 49 y.o.   MRN: [id number]  Diabetes He presents for his follow-up diabetic visit. He has type 2 diabetes mellitus. Current diabetic treatment includes diet. He is compliant with treatment all of the time. His weight is stable. He is following a diabetic diet. He has had a previous visit with a dietitian. Exercise: 5 times a week. He does not see a podiatrist.Eye exam is current.   Results for orders placed or performed in visit on 07/17/15  HM DIABETES EYE EXAM  Result Value Ref Range   HM Diabetic Eye Exam No Retinopathy No Retinopathy  scan of eyes stable  Overall eye s good    Discuss recent labs  Patient arrives office for also for follow-up on liver enzymes. These were elevated. He's cut back alcohol intake. His lost 30 pounds. No abdominal pain.  Watching salt intake. Exercising regularly.  Reports continued rash and distal penis patient concerned about her pruritic at times irritated at times. Using ketoconazole without much success  Day numb [phone]  Review of Systems No headache no fever no chest pain no back pain no abdominal pain no change in bowel habits    Objective:   Physical Exam Alert vitals stable H&T normal. Blood pressure decent repeat lungs clear heart rare rhythm abdomen benign penis at the edge glands there is an erythematous patch slightly scaly       Assessment & Plan:  Impression 1 type II by diabetes much improved discussed. #2 hyperlipidemia. #3 elevated liver enzymes resolved #4 rash penis doubt something serious like bowens disease but time for referral discussed. We either set up with urologist or dermatologist. Plan no change medications. Diet exercise discussed. ENT referral. Encouragement as far as weight loss recheck in 6 months. WSL

## 2015-11-06 ENCOUNTER — Encounter (HOSPITAL_COMMUNITY): Payer: Self-pay | Admitting: Emergency Medicine

## 2015-11-06 ENCOUNTER — Emergency Department (HOSPITAL_COMMUNITY)
Admission: EM | Admit: 2015-11-06 | Discharge: 2015-11-06 | Disposition: A | Discharge status: Home / Self Care | Payer: PRIVATE HEALTH INSURANCE | Attending: Emergency Medicine | Admitting: Emergency Medicine

## 2015-11-06 DIAGNOSIS — Y9289 Other specified places as the place of occurrence of the external cause: Secondary | ICD-10-CM | POA: Insufficient documentation

## 2015-11-06 DIAGNOSIS — Z23 Encounter for immunization: Secondary | ICD-10-CM | POA: Insufficient documentation

## 2015-11-06 DIAGNOSIS — W260XXA Contact with knife, initial encounter: Secondary | ICD-10-CM | POA: Diagnosis not present

## 2015-11-06 DIAGNOSIS — Y9389 Activity, other specified: Secondary | ICD-10-CM | POA: Diagnosis not present

## 2015-11-06 DIAGNOSIS — Z87891 Personal history of nicotine dependence: Secondary | ICD-10-CM | POA: Insufficient documentation

## 2015-11-06 DIAGNOSIS — Y998 Other external cause status: Secondary | ICD-10-CM | POA: Diagnosis not present

## 2015-11-06 DIAGNOSIS — S61412A Laceration without foreign body of left hand, initial encounter: Secondary | ICD-10-CM | POA: Insufficient documentation

## 2015-11-06 DIAGNOSIS — S6992XA Unspecified injury of left wrist, hand and finger(s), initial encounter: Secondary | ICD-10-CM | POA: Diagnosis present

## 2015-11-06 MED ORDER — TETANUS-DIPHTH-ACELL PERTUSSIS 5-2.5-18.5 LF-MCG/0.5 IM SUSP
0.5000 mL | Freq: Once | INTRAMUSCULAR | Status: AC
  Administered 2015-11-06: 0.5 mL via INTRAMUSCULAR
  Filled 2015-11-06: qty 0.5

## 2015-11-06 MED ORDER — LIDOCAINE HCL (PF) 1 % IJ SOLN
INTRAMUSCULAR | Status: AC
  Filled 2015-11-06: qty 5

## 2015-11-06 NOTE — ED Provider Notes (Signed)
CSN: [id number]     Arrival date & time 11/06/15  4098 History   First MD Initiated Contact with Patient 11/06/15 1953     Chief Complaint  Patient presents with  . Extremity Laceration     (Consider location/radiation/quality/duration/timing/severity/associated sxs/prior Treatment) Patient is a 49 y.o. male presenting with skin laceration. The history is provided by the patient.  Laceration Location:  Hand Hand laceration location:  L finger Length (cm):  2.4 Depth:  Cutaneous Quality: straight   Bleeding: controlled   Laceration mechanism:  Knife Pain details:    Quality:  Throbbing   Severity:  Moderate   Progression:  Unchanged Foreign body present:  No foreign bodies Tetanus status:  Out of date   History reviewed. No pertinent past medical history. Past Surgical History  Procedure Laterality Date  . Neck surgery      C5, C6   History reviewed. No pertinent family history. Social History  Substance Use Topics  . Smoking status: Former Smoker    Quit date: 11/22/2014  . Smokeless tobacco: None  . Alcohol Use: No    Review of Systems  Constitutional: Negative for activity change.       All ROS Neg except as noted in HPI  HENT: Negative for nosebleeds.   Eyes: Negative for photophobia and discharge.  Respiratory: Negative for cough, shortness of breath and wheezing.   Cardiovascular: Negative for chest pain and palpitations.  Gastrointestinal: Negative for abdominal pain and blood in stool.  Genitourinary: Negative for dysuria, frequency and hematuria.  Musculoskeletal: Negative for back pain, arthralgias and neck pain.  Skin: Negative.        laceration  Neurological: Negative for dizziness, seizures and speech difficulty.  Psychiatric/Behavioral: Negative for hallucinations and confusion.      Allergies  Biaxin  Home Medications   Prior to Admission medications   Medication Sig Start Date End Date Taking? Authorizing Provider  ACCU-CHEK AVIVA  PLUS test strip USE TO CHECK BLOOD SUGAR ONCE A DAY 06/18/15   Kathyrn Drown, MD  blood glucose meter kit and supplies KIT Dispense based on patient and insurance preference. Tests once a day dx e11.9 04/24/15   Mikey Kirschner, MD   BP 118/68 mmHg  Pulse 56  Temp(Src) 97.8 F (36.6 C) (Oral)  Resp 20  Ht '5\' 10"'  (1.778 m)  Wt 91.627 kg  BMI 28.98 kg/m2  SpO2 99% Physical Exam  Constitutional: He is oriented to person, place, and time. He appears well-developed and well-nourished.  Non-toxic appearance.  HENT:  Head: Normocephalic.  Right Ear: Tympanic membrane and external ear normal.  Left Ear: Tympanic membrane and external ear normal.  Eyes: EOM and lids are normal. Pupils are equal, round, and reactive to light.  Neck: Normal range of motion. Neck supple. Carotid bruit is not present.  Cardiovascular: Normal rate, regular rhythm, normal heart sounds, intact distal pulses and normal pulses.   Pulmonary/Chest: Breath sounds normal. No respiratory distress.  Abdominal: Soft. Bowel sounds are normal. There is no tenderness. There is no guarding.  Musculoskeletal: Normal range of motion.       Hands: 2.4 cm laceration left thumb  Lymphadenopathy:       Head (right side): No submandibular adenopathy present.       Head (left side): No submandibular adenopathy present.    He has no cervical adenopathy.  Neurological: He is alert and oriented to person, place, and time. He has normal strength. No cranial nerve deficit or sensory deficit.  Skin: Skin is warm and dry.  Psychiatric: He has a normal mood and affect. His speech is normal.  Nursing note and vitals reviewed.   ED Course  5 interupted sutures.  Marland Kitchen.Laceration Repair Date/Time: 11/06/2015 8:00 PM Performed by: Lily Kocher Authorized by: Lily Kocher Consent: Verbal consent obtained. Consent given by: patient Patient understanding: patient states understanding of the procedure being performed Patient identity  confirmed: arm band Time out: Immediately prior to procedure a "time out" was called to verify the correct patient, procedure, equipment, support staff and site/side marked as required. Body area: upper extremity Location details: left thumb Laceration length: 2.4 cm Foreign bodies: no foreign bodies Tendon involvement: none Nerve involvement: none Anesthesia: local infiltration Local anesthetic: lidocaine 1% without epinephrine Patient sedated: no Preparation: Patient was prepped and draped in the usual sterile fashion. Irrigation solution: saline Amount of cleaning: standard Skin closure: 4-0 nylon Number of sutures: 5 Technique: simple Approximation: close Approximation difficulty: simple Dressing: gauze roll Patient tolerance: Patient tolerated the procedure well with no immediate complications   (including critical care time) Labs Review Labs Reviewed - No data to display  Imaging Review No results found. I have personally reviewed and evaluated these images and lab results as part of my medical decision-making.   EKG Interpretation None      MDM  Laceration to the left thumb was repaired with 5 interrupted sutures of 4-0 nylon. Patient's tetanus status was updated. Bandage applied. The patient given instructions on having the sutures removed in 7 or 8 days. Discussed the need to return if signs of infection. Patient acknowledges understanding of this discharge instruction.    Final diagnoses:  Laceration of left hand, initial encounter    *I have reviewed nursing notes, vital signs, and all appropriate lab and imaging results for this patient.[address] PA-C 11/08/15 1833  Noemi Chapel, MD 11/10/15 1330

## 2015-11-06 NOTE — Discharge Instructions (Signed)
Please keep the wound clean and dry. Please have your sutures removed in 7 or 8 days. Please see your primary physician, or return to the emergency department if any pus like drainage, fever, or signs of advancing infection. Stitches, Staples, or Adhesive Wound Closure Health care providers use stitches (sutures), staples, and certain glue (skin adhesives) to hold skin together while it heals (wound closure). You may need this treatment after you have surgery or if you cut your skin accidentally. These methods help your skin to heal more quickly and make it less likely that you will have a scar. A wound may take several months to heal completely. The type of wound you have determines when your wound gets closed. In most cases, the wound is closed as soon as possible (primary skin closure). Sometimes, closure is delayed so the wound can be cleaned and allowed to heal naturally. This reduces the chance of infection. Delayed closure may be needed if your wound:  Is caused by a bite.  Happened more than 6 hours ago.  Involves loss of skin or the tissues under the skin.  Has dirt or debris in it that cannot be removed.  Is infected. WHAT ARE THE DIFFERENT KINDS OF WOUND CLOSURES? There are many options for wound closure. The one that your health care provider uses depends on how deep and how large your wound is. Adhesive Glue To use this type of glue to close a wound, your health care provider holds the edges of the wound together and paints the glue on the surface of your skin. You may need more than one layer of glue. Then the wound may be covered with a light bandage (dressing). This type of skin closure may be used for small wounds that are not deep (superficial). Using glue for wound closure is less painful than other methods. It does not require a medicine that numbs the area (local anesthetic). This method also leaves nothing to be removed. Adhesive glue is often used for children and on facial  wounds. Adhesive glue cannot be used for wounds that are deep, uneven, or bleeding. It is not used inside of a wound.  Adhesive Strips These strips are made of sticky (adhesive), porous paper. They are applied across your skin edges like a regular adhesive bandage. You leave them on until they fall off. Adhesive strips may be used to close very superficial wounds. They may also be used along with sutures to improve the closure of your skin edges.  Sutures Sutures are the oldest method of wound closure. Sutures can be made from natural substances, such as silk, or from synthetic materials, such as nylon and steel. They can be made from a material that your body can break down as your wound heals (absorbable), or they can be made from a material that needs to be removed from your skin (nonabsorbable). They come in many different strengths and sizes. Your health care provider attaches the sutures to a steel needle on one end. Sutures can be passed through your skin, or through the tissues beneath your skin. Then they are tied and cut. Your skin edges may be closed in one continuous stitch or in separate stitches. Sutures are strong and can be used for all kinds of wounds. Absorbable sutures may be used to close tissues under the skin. The disadvantage of sutures is that they may cause skin reactions that lead to infection. Nonabsorbable sutures need to be removed. Staples When surgical staples are used to close  a wound, the edges of your skin on both sides of the wound are brought close together. A staple is placed across the wound, and an instrument secures the edges together. Staples are often used to close surgical cuts (incisions). Staples are faster to use than sutures, and they cause less skin reaction. Staples need to be removed using a tool that bends the staples away from your skin. HOW DO I CARE FOR MY WOUND CLOSURE?  Take medicines only as directed by your health care provider.  If you were  prescribed an antibiotic medicine for your wound, finish it all even if you start to feel better.  Use ointments or creams only as directed by your health care provider.  Wash your hands with soap and water before and after touching your wound.  Do not soak your wound in water. Do not take baths, swim, or use a hot tub until your health care provider approves.  Ask your health care provider when you can start showering. Cover your wound if directed by your health care provider.  Do not take out your own sutures or staples.  Do not pick at your wound. Picking can cause an infection.  Keep all follow-up visits as directed by your health care provider. This is important. HOW LONG WILL I HAVE MY WOUND CLOSURE?  Leave adhesive glue on your skin until the glue peels away.  Leave adhesive strips on your skin until the strips fall off.  Absorbable sutures will dissolve within several days.  Nonabsorbable sutures and staples must be removed. The location of the wound will determine how long they stay in. This can range from several days to a couple of weeks. WHEN SHOULD I SEEK HELP FOR MY WOUND CLOSURE? Contact your health care provider if:  You have a fever.  You have chills.  You have drainage, redness, swelling, or pain at your wound.  There is a bad smell coming from your wound.  The skin edges of your wound start to separate after your sutures have been removed.  Your wound becomes thick, raised, and darker in color after your sutures come out (scarring).   This information is not intended to replace advice given to you by your health care provider. Make sure you discuss any questions you have with your health care provider.   Document Released: 08/04/2001 Document Revised: 11/30/2014 Document Reviewed: 04/18/2014 Elsevier Interactive Patient Education [employer].

## 2015-11-06 NOTE — ED Notes (Signed)
Pt has laceration to the left thumb from knife. 

## 2016-01-21 ENCOUNTER — Ambulatory Visit (INDEPENDENT_AMBULATORY_CARE_PROVIDER_SITE_OTHER): Payer: PRIVATE HEALTH INSURANCE | Admitting: Family Medicine

## 2016-01-21 ENCOUNTER — Encounter: Payer: Self-pay | Admitting: Family Medicine

## 2016-01-21 VITALS — BP 120/74 | Ht 71.0 in | Wt 203.0 lb

## 2016-01-21 DIAGNOSIS — E119 Type 2 diabetes mellitus without complications: Secondary | ICD-10-CM

## 2016-01-21 DIAGNOSIS — E785 Hyperlipidemia, unspecified: Secondary | ICD-10-CM

## 2016-01-21 LAB — POCT GLYCOSYLATED HEMOGLOBIN (HGB A1C): Hemoglobin A1C: 5.2

## 2016-01-21 NOTE — Progress Notes (Signed)
   Subjective:    Patient ID: Jeff Mccarthy, male    DOB: 14-Oct-1966, 50 y.o.   MRN: [id number]  Diabetes He presents for his follow-up diabetic visit. He has type 2 diabetes mellitus. No MedicAlert identification noted. He has not had a previous visit with a dietitian. He does not see a podiatrist.Eye exam is current (June 2016 ).   Patient states no other concerns this visit.  Results for orders placed or performed in visit on 01/21/16  POCT HgB A1C  Result Value Ref Range   Hemoglobin A1C 5.2    Has lost fifty pounds, exercisign regulaly  Numbers now excellent   Took the class, learned a lot,  Sugars generally running very good. Exercising extremely. Has lost 50 pounds now.  Review of Systems    no headache no chest pain no back pain ROS otherwise negative Objective:   Physical Exam Alert vital stable weight down 50 pounds HEENT normal lungs clear heart regular in rhythm ankles without edema       Assessment & Plan:  Impression #1 type 2 diabetes excellent control on diet exercise 1 #2 elevated blood pressure completely stable plan diet exercise discussed maintain same approach patient congratulated WSL also get any copies of blood work our way

## 2016-06-04 ENCOUNTER — Telehealth: Payer: Self-pay | Admitting: Family Medicine

## 2016-06-04 NOTE — Telephone Encounter (Signed)
Pt dropped of recent lab results. Results are in result folder.

## 2016-06-04 NOTE — Telephone Encounter (Signed)
rviewed

## 2016-07-21 ENCOUNTER — Ambulatory Visit (INDEPENDENT_AMBULATORY_CARE_PROVIDER_SITE_OTHER): Payer: PRIVATE HEALTH INSURANCE | Admitting: Family Medicine

## 2016-07-21 ENCOUNTER — Encounter: Payer: Self-pay | Admitting: Family Medicine

## 2016-07-21 VITALS — BP 124/88 | Ht 70.0 in | Wt 206.0 lb

## 2016-07-21 DIAGNOSIS — E785 Hyperlipidemia, unspecified: Secondary | ICD-10-CM

## 2016-07-21 DIAGNOSIS — Z0000 Encounter for general adult medical examination without abnormal findings: Secondary | ICD-10-CM

## 2016-07-21 DIAGNOSIS — E119 Type 2 diabetes mellitus without complications: Secondary | ICD-10-CM

## 2016-07-21 LAB — POCT GLYCOSYLATED HEMOGLOBIN (HGB A1C): HEMOGLOBIN A1C: 5

## 2016-07-21 MED ORDER — GLUCOSE BLOOD VI STRP: ORAL_STRIP | 5 refills | Status: DC

## 2016-07-21 NOTE — Progress Notes (Signed)
   Subjective:    Patient ID: Jeff Mccarthy, male    DOB: 01-22-1966, 50 y.o.   MRN: [id number]  HPI The patient comes in today for a wellness visit.  Patient claims compliance with diabetes medication. No obvious side effects. Reports no substantial low sugar spells. Most numbers are generally in good range when checked fasting. Generally does not miss a dose of medication. Watching diabetic diet closely   A review of their health history was completed.  A review of medications was also completed.  Any needed refills; refill on test strips. Test twice a week.   Eating habits: health conscious  Falls/  MVA accidents in past few months: none  Regular exercise: yes, 5 days a week  Specialist pt sees on regular basis: none  Preventative health issues were discussed.   Additional concerns: a1c not done on bloodwork. Done today at office visit. 5.0    Review of Systems  Constitutional: Negative for activity change, appetite change and fever.  HENT: Negative for congestion and rhinorrhea.   Eyes: Negative for discharge.  Respiratory: Negative for cough and wheezing.   Cardiovascular: Negative for chest pain.  Gastrointestinal: Negative for abdominal pain, blood in stool and vomiting.  Genitourinary: Negative for difficulty urinating and frequency.  Musculoskeletal: Negative for neck pain.  Skin: Negative for rash.  Allergic/Immunologic: Negative for environmental allergies and food allergies.  Neurological: Negative for weakness and headaches.  Psychiatric/Behavioral: Negative for agitation.  All other systems reviewed and are negative.      Objective:   Physical Exam  Constitutional: He appears well-developed and well-nourished.  HENT:  Head: Normocephalic and atraumatic.  Right Ear: External ear normal.  Left Ear: External ear normal.  Nose: Nose normal.  Mouth/Throat: Oropharynx is clear and moist.  Eyes: EOM are normal. Pupils are equal, round, and reactive to  light.  Neck: Normal range of motion. Neck supple. No thyromegaly present.  Cardiovascular: Normal rate, regular rhythm and normal heart sounds.   No murmur heard. Pulmonary/Chest: Effort normal and breath sounds normal. No respiratory distress. He has no wheezes.  Abdominal: Soft. Bowel sounds are normal. He exhibits no distension and no mass. There is no tenderness.  Genitourinary: Penis normal.  Musculoskeletal: Normal range of motion. He exhibits no edema.  Lymphadenopathy:    He has no cervical adenopathy.  Neurological: He is alert. He exhibits normal muscle tone.  Skin: Skin is warm and dry. No erythema.  Psychiatric: He has a normal mood and affect. His behavior is normal. Judgment normal.  Vitals reviewed.         Assessment & Plan:  Impression well adult exam anticipatory guidance given diet exercise discussed #2 type 2 diabetes. Control excellent. Diet alone. Long discussion held plan micro-proteinuria analysis. Get the flu shot this fall yearly eye Dr. visits diet exercise discussed blood work reviewed from the workplace follow-up in 6 months WSL

## 2016-07-22 LAB — MICROALBUMIN / CREATININE URINE RATIO
CREATININE, UR: 145.1 mg/dL
MICROALB/CREAT RATIO: 10 mg/g creat (ref 0.0–30.0)
Microalbumin, Urine: 14.5 ug/mL

## 2016-07-26 ENCOUNTER — Encounter: Payer: Self-pay | Admitting: Family Medicine

## 2017-01-20 ENCOUNTER — Encounter: Payer: Self-pay | Admitting: Family Medicine

## 2017-01-20 ENCOUNTER — Ambulatory Visit (INDEPENDENT_AMBULATORY_CARE_PROVIDER_SITE_OTHER): Payer: PRIVATE HEALTH INSURANCE | Admitting: Family Medicine

## 2017-01-20 VITALS — BP 120/80 | Ht 70.0 in | Wt 213.0 lb

## 2017-01-20 DIAGNOSIS — E119 Type 2 diabetes mellitus without complications: Secondary | ICD-10-CM | POA: Diagnosis not present

## 2017-01-20 LAB — POCT GLYCOSYLATED HEMOGLOBIN (HGB A1C): HEMOGLOBIN A1C: 5.3

## 2017-01-20 NOTE — Progress Notes (Signed)
   Subjective:    Patient ID: Jeff Mccarthy, male    DOB: [birth year], 51 y.o.   MRN: [id number]  Diabetes  He presents for his follow-up diabetic visit. He has type 2 diabetes mellitus. No MedicAlert identification noted.   Patient states no other concerns this visit.  Results for orders placed or performed in visit on 01/20/17  POCT HgB A1C  Result Value Ref Range   Hemoglobin A1C 5.3    Most glu 106 or sogeerneally good  Had flu shot already  Had gained  Some weight, muscle mostly per pt  Due eye exam soon, retnal photo last yr awesome   Patient claims compliance with diabetes diet currently not on medicine. No obvious side effects. Reports no substantial low sugar spells. Most numbers are generally in good range when checked fasting. Generally does not miss a dose of medication. Watching diabetic diet closely   Review of Systems No headache, no major weight loss or weight gain, no chest pain no back pain abdominal pain no change in bowel habits complete ROS otherwise negative     Objective:   Physical Exam   Alert vitals stable, NAD. Blood pressure good on repeat. HEENT normal. Lungs clear. Heart regular rate and rhythm.      Assessment & Plan:  Impression type 2 diabetes awesome control. His regained 7 pounds. Still very low sugars off all medications plan check in 6 months for wellness. Blood work through [employer].

## 2017-06-24 LAB — HM DIABETES EYE EXAM

## 2017-07-22 ENCOUNTER — Encounter: Payer: Self-pay | Admitting: Family Medicine

## 2017-07-22 ENCOUNTER — Ambulatory Visit (INDEPENDENT_AMBULATORY_CARE_PROVIDER_SITE_OTHER): Payer: PRIVATE HEALTH INSURANCE | Admitting: Family Medicine

## 2017-07-22 VITALS — BP 126/88 | Ht 70.0 in | Wt 208.0 lb

## 2017-07-22 DIAGNOSIS — E119 Type 2 diabetes mellitus without complications: Secondary | ICD-10-CM

## 2017-07-22 DIAGNOSIS — Z0000 Encounter for general adult medical examination without abnormal findings: Secondary | ICD-10-CM

## 2017-07-22 DIAGNOSIS — Z125 Encounter for screening for malignant neoplasm of prostate: Secondary | ICD-10-CM

## 2017-07-22 LAB — POCT GLYCOSYLATED HEMOGLOBIN (HGB A1C): HEMOGLOBIN A1C: 4.7

## 2017-07-22 NOTE — Progress Notes (Signed)
   Subjective:    Patient ID: Jeff Mccarthy, male    DOB: 12-Jan-1966, 51 y.o.   MRN: [id number]  HPI The patient comes in today for a wellness visit.    A review of their health history was completed.  A review of medications was also completed.  Any needed refills; not taking any prescription meds  Eating habits: health conscious  Falls/  MVA accidents in past few months: none  Regular exercise: exercise 5 days a week.   Specialist pt sees on regular basis: none  Preventative health issues were discussed.   Additional concerns: none  Results for orders placed or performed in visit on 07/22/17  POCT glycosylated hemoglobin (Hb A1C)  Result Value Ref Range   Hemoglobin A1C 4.7   HM DIABETES EYE EXAM  Result Value Ref Range   HM Diabetic Eye Exam No Retinopathy No Retinopathy   A1C  Feet intact   No eye neuropathy present  Do exrcise,   Review of Systems  Constitutional: Negative for activity change, appetite change and fever.  HENT: Negative for congestion and rhinorrhea.   Eyes: Negative for discharge.  Respiratory: Negative for cough and wheezing.   Cardiovascular: Negative for chest pain.  Gastrointestinal: Negative for abdominal pain, blood in stool and vomiting.  Genitourinary: Negative for difficulty urinating and frequency.  Musculoskeletal: Negative for neck pain.  Skin: Negative for rash.  Allergic/Immunologic: Negative for environmental allergies and food allergies.  Neurological: Negative for weakness and headaches.  Psychiatric/Behavioral: Negative for agitation.  All other systems reviewed and are negative.      Objective:   Physical Exam  Constitutional: He appears well-developed and well-nourished.  HENT:  Head: Normocephalic and atraumatic.  Right Ear: External ear normal.  Left Ear: External ear normal.  Nose: Nose normal.  Mouth/Throat: Oropharynx is clear and moist.  Eyes: Pupils are equal, round, and reactive to light. EOM are  normal.  Neck: Normal range of motion. Neck supple. No thyromegaly present.  Cardiovascular: Normal rate, regular rhythm and normal heart sounds.   No murmur heard. Pulmonary/Chest: Effort normal and breath sounds normal. No respiratory distress. He has no wheezes.  Abdominal: Soft. Bowel sounds are normal. He exhibits no distension and no mass. There is no tenderness.  Genitourinary: Penis normal.  Musculoskeletal: Normal range of motion. He exhibits no edema.  Lymphadenopathy:    He has no cervical adenopathy.  Neurological: He is alert. He exhibits normal muscle tone.  Skin: Skin is warm and dry. No erythema.  Psychiatric: He has a normal mood and affect. His behavior is normal. Judgment normal.  Vitals reviewed.         Assessment & Plan:  Impression 1 well and on exam. Overall doing great. Diet discussed. Exercise discussed. Blood work from work discussed. A1c excellent. PSA and micro-proteinuria tests after he turns 51. Patient also wishes to go on and set up his own colonoscopy information given. Follow-up in 6 months

## 2018-01-12 DIAGNOSIS — Z029 Encounter for administrative examinations, unspecified: Secondary | ICD-10-CM

## 2018-01-20 ENCOUNTER — Ambulatory Visit: Payer: PRIVATE HEALTH INSURANCE | Admitting: Family Medicine

## 2018-01-20 ENCOUNTER — Encounter: Payer: Self-pay | Admitting: Family Medicine

## 2018-01-20 VITALS — BP 134/82 | Wt 232.6 lb

## 2018-01-20 DIAGNOSIS — Z79899 Other long term (current) drug therapy: Secondary | ICD-10-CM | POA: Diagnosis not present

## 2018-01-20 DIAGNOSIS — M7712 Lateral epicondylitis, left elbow: Secondary | ICD-10-CM | POA: Diagnosis not present

## 2018-01-20 DIAGNOSIS — E785 Hyperlipidemia, unspecified: Secondary | ICD-10-CM | POA: Diagnosis not present

## 2018-01-20 DIAGNOSIS — E119 Type 2 diabetes mellitus without complications: Secondary | ICD-10-CM

## 2018-01-20 MED ORDER — ATORVASTATIN CALCIUM 20 MG PO TABS: 20.0000 mg | ORAL_TABLET | Freq: Every day | ORAL | 5 refills | Status: DC

## 2018-01-20 NOTE — Progress Notes (Signed)
   Subjective:    Patient ID: Jeff Mccarthy, male    DOB: [birth year], 52 y.o.   MRN: [id number]   Diabetes   He presents for his follow-up diabetic visit. He has type 2 diabetes mellitus. Risk factors for coronary artery disease include diabetes mellitus. Current diabetic treatment includes diet. He is compliant with treatment all of the time. His weight is stable. He is following a diabetic diet. He  has not had a previous visit with a dietitian. He  does not see a podiatrist. Eye exam is current.   Blood work including hgbA1c done at work  HgbA1c 5.4 drawn at work  95 o [place] daily   elbow pain.  Left lateral elbow.  Doing a lot of weightlifting.  Does have a forearm strap.  He has not been using it.  Lab work reviewed in presence of patient Review of Systems No headache, no major weight loss or weight gain, no chest pain no back pain abdominal pain no change in bowel habits complete ROS otherwise negative     Objective:   Physical Exam  Alert and oriented, vitals reviewed and stable, NAD ENT-TM's and ext canals WNL bilat via otoscopic exam Soft palate, tonsils and post pharynx WNL via oropharyngeal exam Neck-symmetric, no masses; thyroid nonpalpable and nontender Pulmonary-no tachypnea or accessory muscle use; Clear without wheezes via auscultation Card--no abnrml murmurs, rhythm reg and rate WNL Carotid pulses symmetric, without bruitsLeft lateral elbow tenderness to deep palpation     Assessment & Plan:  Impression type 2 diabetes control excellent discussed to maintain same approach  2.  Hyperlipidemia.  Lines regarding statin therapy discussed at length.  Many questions answered we will initiate Lipitor  3.  Lateral epicondylitis.  Forearm strap to use discussed anti-inflammatory medicine sparing use discussed  Greater than 50% of this 25 minute face to face visit was spent in counseling and discussion and coordination of care regarding the above  diagnosis/diagnosiesWellness plus chronic in 6 months

## 2018-04-21 ENCOUNTER — Telehealth: Payer: Self-pay | Admitting: *Deleted

## 2018-04-21 ENCOUNTER — Other Ambulatory Visit: Payer: Self-pay | Admitting: Family Medicine

## 2018-04-21 DIAGNOSIS — Z1211 Encounter for screening for malignant neoplasm of colon: Secondary | ICD-10-CM

## 2018-04-21 NOTE — Telephone Encounter (Signed)
Patient came in today stating Dr Gala Romney has not received his referral for his colonoscopy. Please advise

## 2018-04-21 NOTE — Telephone Encounter (Signed)
No referral ever initiated in system & no mention of it in last OV note  Please ask Dr. Richardson Landry to advise

## 2018-04-21 NOTE — Telephone Encounter (Signed)
Referral placed.

## 2018-04-21 NOTE — Telephone Encounter (Signed)
Lets do, my ast wellness visit he states he waas going to set up himself, may have brought it up at last chronic vist and I did not make note then, let's go ahead and do

## 2018-04-21 NOTE — Telephone Encounter (Signed)
For that matter dr Leitha Schuller office may be now insisting on referral with no self referrals anymore

## 2018-04-26 LAB — LIPID PANEL
CHOLESTEROL TOTAL: 153 mg/dL (ref 100–199)
Chol/HDL Ratio: 2.8 ratio (ref 0.0–5.0)
HDL: 55 mg/dL (ref >39)
LDL Calculated: 78 mg/dL (ref 0–99)
Triglycerides: 102 mg/dL (ref 0–149)
VLDL Cholesterol Cal: 20 mg/dL (ref 5–40)

## 2018-04-26 LAB — HEPATIC FUNCTION PANEL
ALBUMIN: 5.1 g/dL (ref 3.5–5.5)
ALT: 33 IU/L (ref 0–44)
AST: 23 IU/L (ref 0–40)
Alkaline Phosphatase: 46 IU/L (ref 39–117)
BILIRUBIN TOTAL: 0.8 mg/dL (ref 0.0–1.2)
Bilirubin, Direct: 0.23 mg/dL (ref 0.00–0.40)
TOTAL PROTEIN: 7.6 g/dL (ref 6.0–8.5)

## 2018-04-26 LAB — MICROALBUMIN / CREATININE URINE RATIO

## 2018-04-27 ENCOUNTER — Telehealth: Payer: Self-pay | Admitting: Family Medicine

## 2018-04-27 NOTE — Telephone Encounter (Signed)
Review blood work results in [facility] from [employer].

## 2018-04-28 ENCOUNTER — Encounter (INDEPENDENT_AMBULATORY_CARE_PROVIDER_SITE_OTHER): Payer: Self-pay

## 2018-04-28 ENCOUNTER — Encounter: Payer: Self-pay | Admitting: Family Medicine

## 2018-05-09 ENCOUNTER — Encounter: Payer: Self-pay | Admitting: Family Medicine

## 2018-06-01 ENCOUNTER — Ambulatory Visit (INDEPENDENT_AMBULATORY_CARE_PROVIDER_SITE_OTHER): Payer: Self-pay

## 2018-06-01 DIAGNOSIS — Z1211 Encounter for screening for malignant neoplasm of colon: Secondary | ICD-10-CM

## 2018-06-01 MED ORDER — NA SULFATE-K SULFATE-MG SULF 17.5-3.13-1.6 GM/177ML PO SOLN: 1.0000 | ORAL | 0 refills | Status: DC

## 2018-06-01 NOTE — Progress Notes (Addendum)
Gastroenterology Pre-Procedure Review  Request Date:06/01/18 Requesting Physician: Dr.Luking ( no previous tcs)  PATIENT REVIEW QUESTIONS: The patient responded to the following health history questions as indicated:    1. Diabetes Melitis: yes (not on any medications ) 2. Joint replacements in the past 12 months: no 3. Major health problems in the past 3 months: no 4. Has an artificial valve or MVP: no 5. Has a defibrillator: no 6. Has been advised in past to take antibiotics in advance of a procedure like teeth cleaning: no 7. Family history of colon cancer: no  8. Alcohol Use: yes- socially 9. History of sleep apnea: no  10. History of coronary artery or other vascular stents placed within the last 12 months: no 11. History of any prior anesthesia complications: no    MEDICATIONS & ALLERGIES:    Patient reports the following regarding taking any blood thinners:   Plavix? no Aspirin? no Coumadin? no Brilinta? no Xarelto? no Eliquis? no Pradaxa? no Savaysa? no Effient? no  Patient confirms/reports the following medications:  Current Outpatient Medications  Medication Sig Dispense Refill  . atorvastatin (LIPITOR) 20 MG tablet Take 1 tablet (20 mg total) by mouth at bedtime. 30 tablet 5  . loratadine (CLARITIN) 10 MG tablet Take 10 mg by mouth daily.    . blood glucose meter kit and supplies KIT Dispense based on patient and insurance preference. Tests once a day dx e11.9 1 each 0  . glucose blood (ACCU-CHEK AVIVA PLUS) test strip USE TO CHECK BLOOD SUGAR ONCE A DAY 50 each 5   No current facility-administered medications for this visit.     Patient confirms/reports the following allergies:  Allergies  Allergen Reactions  . Biaxin [Clarithromycin]     GI upset    No orders of the defined types were placed in this encounter.   AUTHORIZATION INFORMATION Primary Insurance: [place],  [place] #:[id number] Pre-Cert / Josem Kaufmann required: no Pre-Cert / Auth #:  [id number]   SCHEDULE INFORMATION: Procedure has been scheduled as follows:  Date:07/08/18 , Time: 12:15 Location: [facility] Dr.Rourk  This Gastroenterology Pre-Precedure Review Form is being routed to the following provider(s): EG

## 2018-06-01 NOTE — Patient Instructions (Signed)
Jeff Mccarthy  [birth year] MRN: [id number]     Procedure Date: 07/08/18 Time to register: 11:15 Place to register: [facility] Procedure Time: 12:15 Scheduled provider: R. Garfield Cornea, MD    PREPARATION FOR COLONOSCOPY WITH SUPREP BOWEL PREP KIT  Note: Suprep Bowel Prep Kit is a split-dose (2day) regimen. Consumption of BOTH 6-ounce bottles is required for a complete prep.  Please notify us immediately if you are diabetic, take iron supplements, or if you are on Coumadin or any other blood thinners.                                                                                                                                                    2 DAYS BEFORE PROCEDURE:  DATE: 07/06/18  DAY: Wednesday Begin clear liquid diet AFTER your lunch meal. NO SOLID FOODS after this point.  1 DAY BEFORE PROCEDURE:  DATE: 07/07/18   DAY: Thursday Continue clear liquids the entire day - NO SOLID FOOD.     At 6:00pm: Complete steps 1 through 4 below, using ONE (1) 6-ounce bottle, before going to bed. Step 1:  Pour ONE (1) 6-ounce bottle of SUPREP liquid into the mixing container.  Step 2:  Add cool drinking water to the 16 ounce line on the container and mix.  Note: Dilute the solution concentrate as directed prior to use. Step 3:  DRINK ALL the liquid in the container. Step 4:  You MUST drink an additional two (2) or more 16 ounce containers of water over the next one (1) hour.   Continue clear liquids.  DAY OF PROCEDURE:   DATE: 07/08/18   DAY: Friday If you take medications for your heart, blood pressure, or breathing, you may take these medications.    5 hours before your procedure at :7:15am Step 1:  Pour ONE (1) 6-ounce bottle of SUPREP liquid into the mixing container.  Step 2:  Add cool drinking water to the 16 ounce line on the container and mix.  Note: Dilute the solution concentrate as directed prior to use. Step 3:  DRINK ALL the liquid in the container. Step 4:   You MUST drink an additional two (2) or more 16 ounce containers of water over the next one (1) hour. You MUST complete the final glass of water at least 3 hours before your colonoscopy.   Nothing by mouth past: 9:15am  You may take your morning medications with sip of water unless we have instructed otherwise.    Please see below for Dietary Information.  CLEAR LIQUIDS INCLUDE:  Water Jello (NOT red in color)   Ice Popsicles (NOT red in color)   Tea (sugar ok, no milk/cream) Powdered fruit flavored drinks  Coffee (sugar ok, no milk/cream) Gatorade/ Lemonade/ Kool-Aid  (NOT red in color)   Juice: apple, white grape, white  cranberry Soft drinks  Clear bullion, consomme, broth (fat free beef/chicken/vegetable)  Carbonated beverages (any kind)  Strained chicken noodle soup Hard Candy   Remember: Clear liquids are liquids that will allow you to see your fingers on the other side of a clear glass. Be sure liquids are NOT red in color, and not cloudy, but CLEAR.  DO NOT EAT OR DRINK ANY OF THE FOLLOWING:  Dairy products of any kind   Cranberry juice Tomato juice / V8 juice   Grapefruit juice Orange juice     Red grape juice  Do not eat any solid foods, including such foods as: cereal, oatmeal, yogurt, fruits, vegetables, creamed soups, eggs, bread, crackers, pureed foods in a blender, etc.   HELPFUL HINTS FOR DRINKING PREP SOLUTION:   Make sure prep is extremely cold. Mix and refrigerate the the morning of the prep. You may also put in the freezer.   You may try mixing some Crystal Light or Country Time Lemonade if you prefer. Mix in small amounts; add more if necessary.  Try drinking through a straw  Rinse mouth with water or a mouthwash between glasses, to remove after-taste.  Try sipping on a cold beverage /ice/ popsicles between glasses of prep.  Place a piece of sugar-free hard candy in mouth between glasses.  If you become nauseated, try consuming smaller amounts, or  stretch out the time between glasses. Stop for 30-60 minutes, then slowly start back drinking.     OTHER INSTRUCTIONS  You will need a responsible adult at least 51 years of age to accompany you and drive you home. This person must remain in the waiting room during your procedure. The hospital will cancel your procedure if you do not have a responsible adult with you.   1. Wear loose fitting clothing that is easily removed. 2. Leave jewelry and other valuables at home.  3. Remove all body piercing jewelry and leave at home. 4. Total time from sign-in until discharge is approximately 2-3 hours. 5. You should go home directly after your procedure and rest. You can resume normal activities the day after your procedure. 6. The day of your procedure you should not:  Drive  Make legal decisions  Operate machinery  Drink alcohol  Return to work   You may call the office (Dept: [phone]) before 5:00pm, or page the doctor on call [phone]) after 5:00pm, for further instructions, if necessary.   Insurance Information YOU WILL NEED TO CHECK WITH YOUR INSURANCE COMPANY FOR THE BENEFITS OF COVERAGE YOU HAVE FOR THIS PROCEDURE.  UNFORTUNATELY, NOT ALL INSURANCE COMPANIES HAVE BENEFITS TO COVER ALL OR PART OF THESE TYPES OF PROCEDURES.  IT IS YOUR RESPONSIBILITY TO CHECK YOUR BENEFITS, HOWEVER, WE WILL BE GLAD TO ASSIST YOU WITH ANY CODES YOUR INSURANCE COMPANY MAY NEED.    PLEASE NOTE THAT MOST INSURANCE COMPANIES WILL NOT COVER A SCREENING COLONOSCOPY FOR PEOPLE UNDER THE AGE OF 50  IF YOU HAVE [employer] INSURANCE, YOU MAY HAVE BENEFITS FOR A SCREENING COLONOSCOPY BUT IF POLYPS ARE FOUND THE DIAGNOSIS WILL CHANGE AND THEN YOU MAY HAVE A DEDUCTIBLE THAT WILL NEED TO BE MET. SO PLEASE MAKE SURE YOU CHECK YOUR BENEFITS FOR A SCREENING COLONOSCOPY AS WELL AS A DIAGNOSTIC COLONOSCOPY.

## 2018-06-02 NOTE — Progress Notes (Signed)
Ok to schedule. I do not see any DM medications on his med list. If he is on DM meds: half night before, none morning of.

## 2018-07-08 ENCOUNTER — Encounter (HOSPITAL_COMMUNITY): Payer: Self-pay | Admitting: *Deleted

## 2018-07-08 ENCOUNTER — Other Ambulatory Visit: Payer: Self-pay

## 2018-07-08 ENCOUNTER — Ambulatory Visit (HOSPITAL_COMMUNITY)
Admission: RE | Admit: 2018-07-08 | Discharge: 2018-07-08 | Disposition: A | Discharge status: Home / Self Care | Payer: PRIVATE HEALTH INSURANCE | Source: Ambulatory Visit | Attending: Internal Medicine | Admitting: Internal Medicine

## 2018-07-08 ENCOUNTER — Encounter (HOSPITAL_COMMUNITY)
Admission: RE | Disposition: A | Discharge status: Home / Self Care | Payer: Self-pay | Source: Ambulatory Visit | Attending: Internal Medicine

## 2018-07-08 DIAGNOSIS — Z79899 Other long term (current) drug therapy: Secondary | ICD-10-CM | POA: Diagnosis not present

## 2018-07-08 DIAGNOSIS — Z1211 Encounter for screening for malignant neoplasm of colon: Secondary | ICD-10-CM | POA: Diagnosis not present

## 2018-07-08 DIAGNOSIS — Z87891 Personal history of nicotine dependence: Secondary | ICD-10-CM | POA: Diagnosis not present

## 2018-07-08 DIAGNOSIS — K5730 Diverticulosis of large intestine without perforation or abscess without bleeding: Secondary | ICD-10-CM | POA: Diagnosis not present

## 2018-07-08 DIAGNOSIS — D123 Benign neoplasm of transverse colon: Secondary | ICD-10-CM | POA: Diagnosis not present

## 2018-07-08 DIAGNOSIS — D120 Benign neoplasm of cecum: Secondary | ICD-10-CM | POA: Insufficient documentation

## 2018-07-08 HISTORY — PX: POLYPECTOMY: SHX5525

## 2018-07-08 HISTORY — PX: COLONOSCOPY: SHX5424

## 2018-07-08 SURGERY — COLONOSCOPY
Anesthesia: Moderate Sedation

## 2018-07-08 MED ORDER — ONDANSETRON HCL 4 MG/2ML IJ SOLN
INTRAMUSCULAR | Status: DC | PRN
  Administered 2018-07-08: 4 mg via INTRAVENOUS

## 2018-07-08 MED ORDER — ONDANSETRON HCL 4 MG/2ML IJ SOLN
INTRAMUSCULAR | Status: AC
  Filled 2018-07-08: qty 2

## 2018-07-08 MED ORDER — STERILE WATER FOR IRRIGATION IR SOLN
Status: DC | PRN
  Administered 2018-07-08: 1.5 mL

## 2018-07-08 MED ORDER — MEPERIDINE HCL 50 MG/ML IJ SOLN
INTRAMUSCULAR | Status: AC
  Filled 2018-07-08: qty 1

## 2018-07-08 MED ORDER — SODIUM CHLORIDE 0.9 % IV SOLN
INTRAVENOUS | Status: DC
  Administered 2018-07-08: 11:00:00 via INTRAVENOUS

## 2018-07-08 MED ORDER — MIDAZOLAM HCL 5 MG/5ML IJ SOLN
INTRAMUSCULAR | Status: AC
  Filled 2018-07-08: qty 5

## 2018-07-08 MED ORDER — MIDAZOLAM HCL 5 MG/5ML IJ SOLN
INTRAMUSCULAR | Status: DC | PRN
  Administered 2018-07-08: 2 mg via INTRAVENOUS
  Administered 2018-07-08 (×3): 1 mg via INTRAVENOUS

## 2018-07-08 MED ORDER — MEPERIDINE HCL 100 MG/ML IJ SOLN
INTRAMUSCULAR | Status: DC | PRN
  Administered 2018-07-08 (×2): 25 mg via INTRAVENOUS

## 2018-07-08 NOTE — Discharge Instructions (Signed)
Colonoscopy Discharge Instructions  Read the instructions outlined below and refer to this sheet in the next few weeks. These discharge instructions provide you with general information on caring for yourself after you leave the hospital. Your doctor may also give you specific instructions. While your treatment has been planned according to the most current medical practices available, unavoidable complications occasionally occur. If you have any problems or questions after discharge, call Dr. Gala Romney at [phone]. ACTIVITY  You may resume your regular activity, but move at a slower pace for the next 24 hours.   Take frequent rest periods for the next 24 hours.   Walking will help get rid of the air and reduce the bloated feeling in your belly (abdomen).   No driving for 24 hours (because of the medicine (anesthesia) used during the test).    Do not sign any important legal documents or operate any machinery for 24 hours (because of the anesthesia used during the test).  NUTRITION  Drink plenty of fluids.   You may resume your normal diet as instructed by your doctor.   Begin with a light meal and progress to your normal diet. Heavy or fried foods are harder to digest and may make you feel sick to your stomach (nauseated).   Avoid alcoholic beverages for 24 hours or as instructed.  MEDICATIONS  You may resume your normal medications unless your doctor tells you otherwise.  WHAT YOU CAN EXPECT TODAY  Some feelings of bloating in the abdomen.   Passage of more gas than usual.   Spotting of blood in your stool or on the toilet paper.  IF YOU HAD POLYPS REMOVED DURING THE COLONOSCOPY:  No aspirin products for 7 days or as instructed.   No alcohol for 7 days or as instructed.   Eat a soft diet for the next 24 hours.  FINDING OUT THE RESULTS OF YOUR TEST Not all test results are available during your visit. If your test results are not back during the visit, make an appointment  with your caregiver to find out the results. Do not assume everything is normal if you have not heard from your caregiver or the medical facility. It is important for you to follow up on all of your test results.  SEEK IMMEDIATE MEDICAL ATTENTION IF:  You have more than a spotting of blood in your stool.   Your belly is swollen (abdominal distention).   You are nauseated or vomiting.   You have a temperature over 101.   You have abdominal pain or discomfort that is severe or gets worse throughout the day.   Diverticulosis and colon polyp  Information provided   Further recommendations to follow  Pending review of pathology report    Diverticulosis Diverticulosis is a condition that develops when small pouches (diverticula) form in the wall of the large intestine (colon). The colon is where water is absorbed and stool is formed. The pouches form when the inside layer of the colon pushes through weak spots in the outer layers of the colon. You may have a few pouches or many of them. What are the causes? The cause of this condition is not known. What increases the risk? The following factors may make you more likely to develop this condition:  Being older than age 60. Your risk for this condition increases with age. Diverticulosis is rare among people younger than age 33. By age 67, many people have it.  Eating a low-fiber diet.  Having frequent constipation.  Not getting enough exercise. °· Smoking. °· Taking over-the-counter pain medicines, like aspirin and ibuprofen. °· Having a family history of diverticulosis. ° °What are the signs or symptoms? °In most people, there are no symptoms of this condition. If you do have symptoms, they may include: °· Bloating. °· Cramps in the abdomen. °· Constipation or diarrhea. °· Pain in the lower left side of the abdomen. ° °How is this diagnosed? °This condition is most often diagnosed during an exam for other colon problems.  Because diverticulosis usually has no symptoms, it often cannot be diagnosed independently. This condition may be diagnosed by: °· Using a flexible scope to examine the colon (colonoscopy). °· Taking an X-ray of the colon after dye has been put into the colon (barium enema). °· Doing a CT scan. ° °How is this treated? °You may not need treatment for this condition if you have never developed an infection related to diverticulosis. If you have had an infection before, treatment may include: °· Eating a high-fiber diet. This may include eating more fruits, vegetables, and grains. °· Taking a fiber supplement. °· Taking a live bacteria supplement (probiotic). °· Taking medicine to relax your colon. °· Taking antibiotic medicines. ° °Follow these instructions at home: °· Drink 6-8 glasses of water or more each day to prevent constipation. °· Try not to strain when you have a bowel movement. °· If you have had an infection before: °? Eat more fiber as directed by your health care provider or your diet and nutrition specialist (dietitian). °? Take a fiber supplement or probiotic, if your health care provider approves. °· Take over-the-counter and prescription medicines only as told by your health care provider. °· If you were prescribed an antibiotic, take it as told by your health care provider. Do not stop taking the antibiotic even if you start to feel better. °· Keep all follow-up visits as told by your health care provider. This is important. °Contact a health care provider if: °· You have pain in your abdomen. °· You have bloating. °· You have cramps. °· You have not had a bowel movement in 3 days. °Get help right away if: °· Your pain gets worse. °· Your bloating becomes very bad. °· You have a fever or chills, and your symptoms suddenly get worse. °· You vomit. °· You have bowel movements that are bloody or black. °· You have bleeding from your rectum. °Summary °· Diverticulosis is a condition that develops when  small pouches (diverticula) form in the wall of the large intestine (colon). °· You may have a few pouches or many of them. °· This condition is most often diagnosed during an exam for other colon problems. °· If you have had an infection related to diverticulosis, treatment may include increasing the fiber in your diet, taking supplements, or taking medicines. °This information is not intended to replace advice given to you by your health care provider. Make sure you discuss any questions you have with your health care provider. °Document Released: 08/06/2004 Document Revised: 09/28/2016 Document Reviewed: 09/28/2016 °Elsevier Interactive Patient Education © 2017 [employer] ° °Colon Polyps °Polyps are tissue growths inside the body. Polyps can grow in many places, including the large intestine (colon). A polyp may be a round bump or a mushroom-shaped growth. You could have one polyp or several. °Most colon polyps are noncancerous (benign). However, some colon polyps can become cancerous over time. °What are the causes? °The exact cause of colon polyps is not known. °What   colon polyps is not known. What increases the risk? This condition is more likely to develop in people who:  Have a family history of colon cancer or colon polyps.  Are older than 21 or older than 45 if they are African American.  Have inflammatory bowel disease, such as ulcerative colitis or Crohn disease.  Are overweight.  Smoke cigarettes.  Do not get enough exercise.  Drink too much alcohol.  Eat a diet that is: ? High in fat and red meat. ? Low in fiber.  Had childhood cancer that was treated with abdominal radiation.  What are the signs or symptoms? Most polyps do not cause symptoms. If you have symptoms, they may include:  Blood coming from your rectum when having a bowel movement.  Blood in your stool.The stool may look dark red or black.  A change in bowel habits, such as constipation or diarrhea.  How is this diagnosed? This  condition is diagnosed with a colonoscopy. This is a procedure that uses a lighted, flexible scope to look at the inside of your colon. How is this treated? Treatment for this condition involves removing any polyps that are found. Those polyps will then be tested for cancer. If cancer is found, your health care provider will talk to you about options for colon cancer treatment. Follow these instructions at home: Diet  Eat plenty of fiber, such as fruits, vegetables, and whole grains.  Eat foods that are high in calcium and vitamin D, such as milk, cheese, yogurt, eggs, liver, fish, and broccoli.  Limit foods high in fat, red meats, and processed meats, such as hot dogs, sausage, bacon, and lunch meats.  Maintain a healthy weight, or lose weight if recommended by your health care provider. General instructions  Do not smoke cigarettes.  Do not drink alcohol excessively.  Keep all follow-up visits as told by your health care provider. This is important. This includes keeping regularly scheduled colonoscopies. Talk to your health care provider about when you need a colonoscopy.  Exercise every day or as told by your health care provider. Contact a health care provider if:  You have new or worsening bleeding during a bowel movement.  You have new or increased blood in your stool.  You have a change in bowel habits.  You unexpectedly lose weight. This information is not intended to replace advice given to you by your health care provider. Make sure you discuss any questions you have with your health care provider. Document Released: 08/05/2004 Document Revised: 04/16/2016 Document Reviewed: 09/30/2015 Elsevier Interactive Patient Education  2018 [place].    High-Fiber Diet Fiber, also called dietary fiber, is a type of carbohydrate found in fruits, vegetables, whole grains, and beans. A high-fiber diet can have many health benefits. Your health care provider may recommend a  high-fiber diet to help:  Prevent constipation. Fiber can make your bowel movements more regular.  Lower your cholesterol.  Relieve hemorrhoids, uncomplicated diverticulosis, or irritable bowel syndrome.  Prevent overeating as part of a weight-loss plan.  Prevent heart disease, type 2 diabetes, and certain cancers.  What is my plan? The recommended daily intake of fiber includes:  38 grams for men under age 41.  47 grams for men over age 20.  28 grams for women under age 39.  53 grams for women over age 52.  You can get the recommended daily intake of dietary fiber by eating a variety of fruits, vegetables, grains, and beans. Your health care provider may  also recommend a fiber supplement if it is not possible to get enough fiber through your diet. What do I need to know about a high-fiber diet?  Fiber supplements have not been widely studied for their effectiveness, so it is better to get fiber through food sources.  Always check the fiber content on thenutrition facts label of any prepackaged food. Look for foods that contain at least 5 grams of fiber per serving.  Ask your dietitian if you have questions about specific foods that are related to your condition, especially if those foods are not listed in the following section.  Increase your daily fiber consumption gradually. Increasing your intake of dietary fiber too quickly may cause bloating, cramping, or gas.  Drink plenty of water. Water helps you to digest fiber. What foods can I eat? Grains Whole-grain breads. Multigrain cereal. Oats and oatmeal. Brown rice. Barley. Bulgur wheat. [place]. Bran muffins. Popcorn. Rye wafer crackers. Vegetables Sweet potatoes. Spinach. Kale. Artichokes. Cabbage. Broccoli. Green peas. Carrots. Squash. Fruits Berries. Pears. Apples. Oranges. Avocados. Prunes and raisins. Dried figs. Meats and Other Protein Sources Navy, kidney, pinto, and soy beans. Split peas. Lentils. Nuts and  seeds. Dairy Fiber-fortified yogurt. Beverages Fiber-fortified soy milk. Fiber-fortified orange juice. Other Fiber bars. The items listed above may not be a complete list of recommended foods or beverages. Contact your dietitian for more options. What foods are not recommended? Grains White bread. Pasta made with refined flour. White rice. Vegetables Fried potatoes. Canned vegetables. Well-cooked vegetables. Fruits Fruit juice. Cooked, strained fruit. Meats and Other Protein Sources Fatty cuts of meat. Fried Sales executive or fried fish. Dairy Milk. Yogurt. Cream cheese. Sour cream. Beverages Soft drinks. Other Cakes and pastries. Butter and oils. The items listed above may not be a complete list of foods and beverages to avoid. Contact your dietitian for more information. What are some tips for including high-fiber foods in my diet?  Eat a wide variety of high-fiber foods.  Make sure that half of all grains consumed each day are whole grains.  Replace breads and cereals made from refined flour or white flour with whole-grain breads and cereals.  Replace white rice with brown rice, bulgur wheat, or millet.  Start the day with a breakfast that is high in fiber, such as a cereal that contains at least 5 grams of fiber per serving.  Use beans in place of meat in soups, salads, or pasta.  Eat high-fiber snacks, such as berries, raw vegetables, nuts, or popcorn. This information is not intended to replace advice given to you by your health care provider. Make sure you discuss any questions you have with your health care provider. Document Released: 11/09/2005 Document Revised: 04/16/2016 Document Reviewed: 04/24/2014 Elsevier Interactive Patient Education  [employer].

## 2018-07-08 NOTE — Op Note (Signed)
[facility] Patient Name: Jeff Mccarthy Procedure Date: 07/08/2018 11:15 AM MRN: [id number] Date of Birth: 04-18-1966 Attending MD: Norvel Richards , MD CSN: [id number] Age: 52 Admit Type: Outpatient Procedure:                Colonoscopy Indications:              Screening for colorectal malignant neoplasm Providers:                Norvel Richards, MD, Lurline Del, RN, Randa Spike, Technician Referring MD:              Medicines:                Midazolam 5 mg IV, Meperidine 50 mg IV Complications:            No immediate complications. Estimated Blood Loss:     Estimated blood loss was minimal. Procedure:                Pre-Anesthesia Assessment:                           - Prior to the procedure, a History and Physical                            was performed, and patient medications and                            allergies were reviewed. The patient's tolerance of                            previous anesthesia was also reviewed. The risks                            and benefits of the procedure and the sedation                            options and risks were discussed with the patient.                            All questions were answered, and informed consent                            was obtained. Prior Anticoagulants: The patient has                            taken no previous anticoagulant or antiplatelet                            agents. ASA Grade Assessment: II - A patient with                            mild systemic disease. After reviewing the risks  and benefits, the patient was deemed in                            satisfactory condition to undergo the procedure.                           After obtaining informed consent, the colonoscope                            was passed under direct vision. Throughout the                            procedure, the patient's blood pressure, pulse, and                   oxygen saturations were monitored continuously. The                            CF-HQ190L ([id number]) scope was introduced through                            the anus and advanced to the the cecum, identified                            by appendiceal orifice and ileocecal valve. The                            colonoscopy was performed without difficulty. The                            patient tolerated the procedure well. The quality                            of the bowel preparation was adequate. The entire                            colon was well visualized. Scope In: 11:44:08 AM Scope Out: 12:19:75 PM Scope Withdrawal Time: 0 hours 16 minutes 33 seconds  Total Procedure Duration: 0 hours 20 minutes 33 seconds  Findings:      The perianal and digital rectal examinations were normal.      A 7 mm polyp was found in the cecum. The polyp was sessile. The polyp       was removed with a hot snare. Resection and retrieval were complete.       Estimated blood loss: none.      A 5 mm polyp was found in the hepatic flexure. The polyp was sessile.       The polyp was removed with a cold snare. Resection and retrieval were       complete. Estimated blood loss was minimal.      Scattered small and large-mouthed diverticula were found in the sigmoid       colon and descending colon.      The exam was otherwise without abnormality on direct and retroflexion       views. Impression:               -  One 7 mm polyp in the cecum, removed with a hot                            snare. Resected and retrieved.                           - One 5 mm polyp at the hepatic flexure, removed                            with a cold snare. Resected and retrieved.                           - Diverticulosis in the sigmoid colon and in the                            descending colon.                           - The examination was otherwise normal on direct                            and retroflexion  views. Moderate Sedation:      Moderate (conscious) sedation was administered by the endoscopy nurse       and supervised by the endoscopist. The following parameters were       monitored: oxygen saturation, heart rate, blood pressure, respiratory       rate, EKG, adequacy of pulmonary ventilation, and response to care.       Total physician intraservice time was 26 minutes. Recommendation:           - Patient has a contact number available for                            emergencies. The signs and symptoms of potential                            delayed complications were discussed with the                            patient. Return to normal activities tomorrow.                            Written discharge instructions were provided to the                            patient.                           - Resume previous diet.                           - Continue present medications.                           - Repeat colonoscopy date to be determined after  pending pathology results are reviewed for                            surveillance.                           - Return to GI office after studies are complete. Procedure Code(s):        --- Professional ---                           [phone], Colonoscopy, flexible; with removal of                            tumor(s), polyp(s), or other lesion(s) by snare                            technique                           G0500, Moderate sedation services provided by the                            same physician or other qualified health care                            professional performing a gastrointestinal                            endoscopic service that sedation supports,                            requiring the presence of an independent trained                            observer to assist in the monitoring of the                            patient's level of consciousness and physiological                             status; initial 15 minutes of intra-service time;                            patient age 53 years or older (additional time may                            be reported with [phone], as appropriate)                           [id number], Moderate sedation services provided by the                            same physician or other qualified health care  professional performing the diagnostic or                            therapeutic service that the sedation supports,                            requiring the presence of an independent trained                            observer to assist in the monitoring of the                            patient's level of consciousness and physiological                            status; each additional 15 minutes intraservice                            time (List separately in addition to code for                            primary service) Diagnosis Code(s):        --- Professional ---                           Z12.11, Encounter for screening for malignant                            neoplasm of colon                           D12.0, Benign neoplasm of cecum                           D12.3, Benign neoplasm of transverse colon (hepatic                            flexure or splenic flexure)                           K57.30, Diverticulosis of large intestine without                            perforation or abscess without bleeding CPT copyright 2017 American [employer]. All rights reserved. The codes documented in this report are preliminary and upon coder review may  be revised to meet current compliance requirements. Cristopher Estimable. Jaiyana Canale, MD Norvel Richards, MD 07/08/2018 12:10:44 PM This report has been signed electronically. Number of Addenda: 0

## 2018-07-08 NOTE — H&P (Signed)
'@LOGO' @   Primary Care Physician:  Mikey Kirschner, MD Primary Gastroenterologist:  Dr. Gala Romney  Pre-Procedure History & Physical: HPI:  Jeff Mccarthy is a 52 y.o. male is here for a screening colonoscopy. No bowel symptoms. No family history of colon cancer. No prior colonoscopy.  History reviewed. No pertinent past medical history.  Past Surgical History:  Procedure Laterality Date  . NECK SURGERY     C5, C6    Prior to Admission medications   Medication Sig Start Date End Date Taking? Authorizing Provider  atorvastatin (LIPITOR) 20 MG tablet Take 1 tablet (20 mg total) by mouth at bedtime. 01/20/18  Yes Mikey Kirschner, MD  loratadine (CLARITIN) 10 MG tablet Take 10 mg by mouth daily as needed for allergies.    Yes [provider]  Na Sulfate-K Sulfate-Mg Sulf (SUPREP BOWEL PREP KIT) 17.5-3.13-1.6 GM/177ML SOLN Take 1 kit by mouth as directed. 06/01/18  Yes Carlis Stable, NP  blood glucose meter kit and supplies KIT Dispense based on patient and insurance preference. Tests once a day dx e11.9 Patient not taking: Reported on 06/30/2018 04/24/15   Mikey Kirschner, MD  glucose blood (ACCU-CHEK AVIVA PLUS) test strip USE TO CHECK BLOOD SUGAR ONCE A DAY Patient not taking: Reported on 06/30/2018 07/21/16   Mikey Kirschner, MD    Allergies as of 06/01/2018 - Review Complete 06/01/2018  Allergen Reaction Noted  . Biaxin [clarithromycin]  04/17/2015    History reviewed. No pertinent family history.  Social History   Socioeconomic History  . Marital status: Married    Spouse name: Not on file  . Number of children: Not on file  . Years of education: Not on file  . Highest education level: Not on file  Occupational History  . Not on file  Social Needs  . Financial resource strain: Not on file  . Food insecurity:    Worry: Not on file    Inability: Not on file  . Transportation needs:    Medical: Not on file    Non-medical: Not on file  Tobacco Use  . Smoking  status: Former Smoker    Last attempt to quit: 11/22/2014    Years since quitting: 3.6  . Smokeless tobacco: Never Used  Substance and Sexual Activity  . Alcohol use: No    Alcohol/week: 0.0 standard drinks  . Drug use: Not on file  . Sexual activity: Not on file  Lifestyle  . Physical activity:    Days per week: Not on file    Minutes per session: Not on file  . Stress: Not on file  Relationships  . Social connections:    Talks on phone: Not on file    Gets together: Not on file    Attends religious service: Not on file    Active member of club or organization: Not on file    Attends meetings of clubs or organizations: Not on file    Relationship status: Not on file  . Intimate partner violence:    Fear of current or ex partner: Not on file    Emotionally abused: Not on file    Physically abused: Not on file    Forced sexual activity: Not on file  Other Topics Concern  . Not on file  Social History Narrative  . Not on file    Review of Systems: See HPI, otherwise negative ROS  Physical Exam: BP 129/88   Pulse (!) 58   Temp 98.2 F (36.8  C) (Oral)   Resp 20   Ht '5\' 10"'  (1.778 m)   Wt 104.8 kg   SpO2 96%   BMI 33.15 kg/m  General:   Alert,  Well-developed, well-nourished, pleasant and cooperative in NAD Neck:  Supple; no masses or thyromegaly. Lungs:  Clear throughout to auscultation.   No wheezes, crackles, or rhonchi. No acute distress. Heart:  Regular rate and rhythm; no murmurs, clicks, rubs,  or gallops. Abdomen:  Soft, nontender and nondistended. No masses, hepatosplenomegaly or hernias noted. Normal bowel sounds, without guarding, and without rebound.     Impression/Plan: Jeff Mccarthy is now here to undergo a screening colonoscopy.  First ever average risk screening  Examination.  Risks, benefits, limitations, imponderables and alternatives regarding colonoscopy have been reviewed with the patient. Questions have been answered. All parties agreeable.      Notice:  This dictation was prepared with Dragon dictation along with smaller phrase technology. Any transcriptional errors that result from this process are unintentional and may not be corrected upon review.

## 2018-07-12 ENCOUNTER — Encounter: Payer: Self-pay | Admitting: Internal Medicine

## 2018-07-12 ENCOUNTER — Telehealth: Payer: Self-pay

## 2018-07-12 NOTE — Telephone Encounter (Signed)
Noted. Spoke with pt and he will monitor bleeding. Results were also reviewed over the phone with pt.

## 2018-07-12 NOTE — Telephone Encounter (Signed)
Pt called to let RMR know that he passed a fare amount of blood in the towel three times this morning s/p his procedure on 07/08/18. It was more than a tablespoon of blood seen in the toilet. Pt started having lower back pain Saturday evening with mild cramping in his back this morning. Pt isn't lightheaded, no nausea, no vomiting felt by pt.

## 2018-07-12 NOTE — Telephone Encounter (Signed)
One of the scabs from where the polyps were removed may have caused some bleeding. Would monitor. I would hope it would taper off over the next 24 hours. Let us know if it does not. Please let patient know verbally the contents of the letter which was created for him this morning. Thanks.

## 2018-07-13 ENCOUNTER — Encounter (HOSPITAL_COMMUNITY)
Admission: EM | Disposition: A | Discharge status: Home / Self Care | Payer: Self-pay | Source: Home / Self Care | Attending: Emergency Medicine

## 2018-07-13 ENCOUNTER — Encounter (HOSPITAL_COMMUNITY): Payer: Self-pay | Admitting: Emergency Medicine

## 2018-07-13 ENCOUNTER — Encounter: Payer: Self-pay | Admitting: Internal Medicine

## 2018-07-13 ENCOUNTER — Other Ambulatory Visit: Payer: Self-pay

## 2018-07-13 ENCOUNTER — Emergency Department (HOSPITAL_COMMUNITY)
Admission: EM | Admit: 2018-07-13 | Discharge: 2018-07-13 | Disposition: A | Discharge status: Home / Self Care | Payer: PRIVATE HEALTH INSURANCE | Attending: Emergency Medicine | Admitting: Emergency Medicine

## 2018-07-13 DIAGNOSIS — K5790 Diverticulosis of intestine, part unspecified, without perforation or abscess without bleeding: Secondary | ICD-10-CM | POA: Diagnosis not present

## 2018-07-13 DIAGNOSIS — K91840 Postprocedural hemorrhage and hematoma of a digestive system organ or structure following a digestive system procedure: Secondary | ICD-10-CM | POA: Insufficient documentation

## 2018-07-13 DIAGNOSIS — Z86010 Personal history of colonic polyps: Secondary | ICD-10-CM | POA: Insufficient documentation

## 2018-07-13 DIAGNOSIS — E119 Type 2 diabetes mellitus without complications: Secondary | ICD-10-CM | POA: Diagnosis not present

## 2018-07-13 DIAGNOSIS — E669 Obesity, unspecified: Secondary | ICD-10-CM | POA: Insufficient documentation

## 2018-07-13 DIAGNOSIS — K921 Melena: Secondary | ICD-10-CM | POA: Diagnosis not present

## 2018-07-13 DIAGNOSIS — Z881 Allergy status to other antibiotic agents status: Secondary | ICD-10-CM | POA: Diagnosis not present

## 2018-07-13 DIAGNOSIS — Z79899 Other long term (current) drug therapy: Secondary | ICD-10-CM | POA: Diagnosis not present

## 2018-07-13 DIAGNOSIS — Z9889 Other specified postprocedural states: Secondary | ICD-10-CM | POA: Insufficient documentation

## 2018-07-13 DIAGNOSIS — R42 Dizziness and giddiness: Secondary | ICD-10-CM | POA: Diagnosis not present

## 2018-07-13 DIAGNOSIS — Z87891 Personal history of nicotine dependence: Secondary | ICD-10-CM | POA: Diagnosis not present

## 2018-07-13 DIAGNOSIS — E785 Hyperlipidemia, unspecified: Secondary | ICD-10-CM | POA: Diagnosis not present

## 2018-07-13 DIAGNOSIS — R61 Generalized hyperhidrosis: Secondary | ICD-10-CM | POA: Insufficient documentation

## 2018-07-13 DIAGNOSIS — Z6833 Body mass index (BMI) 33.0-33.9, adult: Secondary | ICD-10-CM | POA: Diagnosis not present

## 2018-07-13 DIAGNOSIS — K625 Hemorrhage of anus and rectum: Secondary | ICD-10-CM

## 2018-07-13 HISTORY — PX: COLONOSCOPY: SHX5424

## 2018-07-13 HISTORY — DX: Type 2 diabetes mellitus without complications: E11.9

## 2018-07-13 LAB — CBC WITH DIFFERENTIAL/PLATELET
Basophils Absolute: 0 10*3/uL (ref 0.0–0.1)
Basophils Relative: 0 %
EOS PCT: 0 %
Eosinophils Absolute: 0 10*3/uL (ref 0.0–0.7)
HCT: 39.9 % (ref 39.0–52.0)
HEMOGLOBIN: 14 g/dL (ref 13.0–17.0)
Lymphocytes Relative: 22 %
Lymphs Abs: 2 10*3/uL (ref 0.7–4.0)
MCH: 31.9 pg (ref 26.0–34.0)
MCHC: 35.1 g/dL (ref 30.0–36.0)
MCV: 90.9 fL (ref 78.0–100.0)
MONOS PCT: 6 %
Monocytes Absolute: 0.6 10*3/uL (ref 0.1–1.0)
Neutro Abs: 6.3 10*3/uL (ref 1.7–7.7)
Neutrophils Relative %: 72 %
PLATELETS: 211 10*3/uL (ref 150–400)
RBC: 4.39 MIL/uL (ref 4.22–5.81)
RDW: 12.5 % (ref 11.5–15.5)
WBC: 9 10*3/uL (ref 4.0–10.5)

## 2018-07-13 LAB — BASIC METABOLIC PANEL
Anion gap: 11 (ref 5–15)
BUN: 23 mg/dL — ABNORMAL HIGH (ref 6–20)
CHLORIDE: 101 mmol/L (ref 98–111)
CO2: 26 mmol/L (ref 22–32)
Calcium: 9.1 mg/dL (ref 8.9–10.3)
Creatinine, Ser: 1.06 mg/dL (ref 0.61–1.24)
GFR calc Af Amer: >60 mL/min (ref >60)
GFR calc non Af Amer: >60 mL/min (ref >60)
Glucose, Bld: 174 mg/dL — ABNORMAL HIGH (ref 70–99)
Potassium: 4.4 mmol/L (ref 3.5–5.1)
SODIUM: 138 mmol/L (ref 135–145)

## 2018-07-13 LAB — TYPE AND SCREEN
ABO/RH(D): A POS
Antibody Screen: NEGATIVE

## 2018-07-13 SURGERY — COLONOSCOPY
Anesthesia: Moderate Sedation

## 2018-07-13 MED ORDER — MEPERIDINE HCL 50 MG/ML IJ SOLN
INTRAMUSCULAR | Status: AC
  Filled 2018-07-13: qty 1

## 2018-07-13 MED ORDER — ONDANSETRON HCL 4 MG/2ML IJ SOLN
INTRAMUSCULAR | Status: DC | PRN
  Administered 2018-07-13: 4 mg via INTRAVENOUS

## 2018-07-13 MED ORDER — PEG 3350-KCL-NA BICARB-NACL 420 G PO SOLR
2000.0000 mL | Freq: Once | ORAL | Status: AC
  Administered 2018-07-13: 2000 mL via ORAL
  Filled 2018-07-13: qty 4000

## 2018-07-13 MED ORDER — STERILE WATER FOR IRRIGATION IR SOLN
Status: DC | PRN
  Administered 2018-07-13: 1.5 mL

## 2018-07-13 MED ORDER — MIDAZOLAM HCL 5 MG/5ML IJ SOLN
INTRAMUSCULAR | Status: DC | PRN
  Administered 2018-07-13 (×2): 1 mg via INTRAVENOUS
  Administered 2018-07-13: 2 mg via INTRAVENOUS
  Administered 2018-07-13: 1 mg via INTRAVENOUS

## 2018-07-13 MED ORDER — ONDANSETRON HCL 4 MG/2ML IJ SOLN
INTRAMUSCULAR | Status: AC
  Filled 2018-07-13: qty 2

## 2018-07-13 MED ORDER — MIDAZOLAM HCL 5 MG/5ML IJ SOLN
INTRAMUSCULAR | Status: AC
  Filled 2018-07-13: qty 5

## 2018-07-13 MED ORDER — MEPERIDINE HCL 100 MG/ML IJ SOLN
INTRAMUSCULAR | Status: DC | PRN
  Administered 2018-07-13: 50 mg via INTRAVENOUS

## 2018-07-13 MED ORDER — SODIUM CHLORIDE 0.9 % IV SOLN
INTRAVENOUS | Status: DC
  Administered 2018-07-13: 1000 mL via INTRAVENOUS

## 2018-07-13 NOTE — Op Note (Addendum)
[facility] Patient Name: Jeff Mccarthy Procedure Date: 07/13/2018 2:21 PM MRN: [id number] Date of Birth: August 27, 1966 Attending MD: Norvel Richards , MD CSN: [id number] Age: 52 Admit Type: Outpatient Procedure:                Colonoscopy Indications:              Therapeutic procedure, , Treatment of bleeding from                            polypectomy site; hemoglobin 14 today Providers:                Norvel Richards, MD, Lurline Del, RN, Aram Candela Referring MD:              Medicines:                Midazolam 5 mg IV, Meperidine 50 mg IV, Ondansetron                            4 mg IV Complications:            No immediate complications. Estimated Blood Loss:     Estimated blood loss: none. Procedure:                Pre-Anesthesia Assessment:                           - Prior to the procedure, a History and Physical                            was performed, and patient medications and                            allergies were reviewed. The patient's tolerance of                            previous anesthesia was also reviewed. The risks                            and benefits of the procedure and the sedation                            options and risks were discussed with the patient.                            All questions were answered, and informed consent                            was obtained. Prior Anticoagulants: The patient has                            taken no previous anticoagulant or antiplatelet  agents. ASA Grade Assessment: II - A patient with                            mild systemic disease. After reviewing the risks                            and benefits, the patient was deemed in                            satisfactory condition to undergo the procedure.                           After obtaining informed consent, the colonoscope                            was passed under direct vision.  Throughout the                            procedure, the patient's blood pressure, pulse, and                            oxygen saturations were monitored continuously. The                            CF-HQ190L ([id number]) scope was introduced through                            the anus and advanced to the the cecum, identified                            by appendiceal orifice and ileocecal valve. The                            colonoscopy was performed without difficulty. The                            patient tolerated the procedure well. The ileocecal                            valve, appendiceal orifice, and rectum were                            photographed. Scope In: 3:17:28 PM Scope Out: 3:32:27 PM Scope Withdrawal Time: 0 hours 11 minutes 1 second  Total Procedure Duration: 0 hours 14 minutes 59 seconds  Findings:      The perianal and digital rectal examinations were normal. Small amount       of clot and blood found in the rectum which trailed all the way up to       the site of hot snare polypectomy in the cecum. There was a protruding       clot adherent on the polypectomy site - without active bleeding seen. 3       hemostasis clips placed on this polypectomy site which sealed it nicely.  Diverticulosis noted as previously seen last week. Prior cold snare       polypectomy site not apparent today. Impression:               Post polypectomy bleed status post bleeding control                            therapy as described above. Moderate Sedation:      Moderate (conscious) sedation was administered by the endoscopy nurse       and supervised by the endoscopist. The following parameters were       monitored: oxygen saturation, heart rate, blood pressure, respiratory       rate, EKG, adequacy of pulmonary ventilation, and response to care.       Total physician intraservice time was 19 minutes. Recommendation:           - Patient has a contact number available for                             emergencies. The signs and symptoms of potential                            delayed complications were discussed with the                            patient. Return to normal activities tomorrow.                            Written discharge instructions were provided to the                            patient.                           - Resume previous diet.                           - Continue present medications.                           - Repeat colonoscopy in 5 years for surveillance.                            no future MRI until clip gone.                           - Return to GI office PRN. Procedure Code(s):        --- Professional ---                           [phone], Colonoscopy, flexible; diagnostic, including                            collection of specimen(s) by brushing or washing,                            when performed (separate procedure)  G0500, Moderate sedation services provided by the                            same physician or other qualified health care                            professional performing a gastrointestinal                            endoscopic service that sedation supports,                            requiring the presence of an independent trained                            observer to assist in the monitoring of the                            patient's level of consciousness and physiological                            status; initial 15 minutes of intra-service time;                            patient age 70 years or older (additional time may                            be reported with [phone], as appropriate) Diagnosis Code(s):        --- Professional ---                           [phone], Postprocedural hemorrhage of a digestive                            system organ or structure following a digestive                            system procedure CPT copyright 2017 American Medical Association. All  rights reserved. The codes documented in this report are preliminary and upon coder review may  be revised to meet current compliance requirements. Cristopher Estimable. Dasja Brase, MD Norvel Richards, MD 07/13/2018 3:39:48 PM This report has been signed electronically. Number of Addenda: 0

## 2018-07-13 NOTE — ED Provider Notes (Signed)
[facility] Provider Note   CSN: [id number] Arrival date & time: 07/13/18  3903     History   Chief Complaint Chief Complaint  Patient presents with  . Rectal Bleeding    recent polypectomy    HPI Jeff Mccarthy is a 52 y.o. male.  Patient presents with fairly significant bloody bowel movements starting yesterday.  Is tapered off a little bit since 3 in the morning.  Patient status post colonoscopy on Friday where he had some polyps removed.  Patient had a little bit of bleeding starting over the past few days but got sniffily worse last night.  His GI doctor Dr. Gala Romney is aware.  Plan is probably to take him back to the colonoscopy suite and reevaluate.  Here patient without any syncope no chest pain no abdominal pain no shortness of breath.  Vital signs are normal not tachycardic not hypotensive.  Patient seen by the GI nurse practitioner.     Past Medical History:  Diagnosis Date  . Diabetes mellitus without complication [facility])     Patient Active Problem List   Diagnosis Date Noted  . Hematochezia   . Dizziness   . Diaphoresis   . Hx of colonoscopy   . Diabetes ([place]) 04/20/2015  . Elevated liver enzymes 04/20/2015  . Impaired fasting glucose 12/02/2014  . Hyperlipidemia 12/02/2014  . Obesity 12/02/2014  . LATERAL EPICONDYLITIS 02/10/2010    Past Surgical History:  Procedure Laterality Date  . COLONOSCOPY    . COLONOSCOPY N/A 07/08/2018   Procedure: COLONOSCOPY;  Surgeon: Daneil Dolin, MD;  Location: [facility];  Service: Endoscopy;  Laterality: N/A;  12:15  . NECK SURGERY     C5, C6  . POLYPECTOMY    . POLYPECTOMY  07/08/2018   Procedure: POLYPECTOMY;  Surgeon: Daneil Dolin, MD;  Location: [facility];  Service: Endoscopy;;  cecal, hepatic flexure,        Home Medications    Prior to Admission medications   Medication Sig Start Date End Date Taking? Authorizing Provider  atorvastatin (LIPITOR) 20 MG tablet Take 20 mg by mouth daily.    Yes [provider]  loratadine (CLARITIN) 10 MG tablet Take 10 mg by mouth daily.   Yes [provider]    Family History History reviewed. No pertinent family history.  Social History Social History   Tobacco Use  . Smoking status: Former Smoker    Last attempt to quit: 11/22/2014    Years since quitting: 3.6  . Smokeless tobacco: Never Used  Substance Use Topics  . Alcohol use: No    Alcohol/week: 0.0 standard drinks  . Drug use: Not on file     Allergies   Biaxin [clarithromycin]   Review of Systems Review of Systems  Constitutional: Negative for fever.  HENT: Negative for congestion.   Eyes: Negative for visual disturbance.  Respiratory: Negative for shortness of breath.   Cardiovascular: Negative for chest pain.  Gastrointestinal: Positive for blood in stool. Negative for abdominal pain.  Genitourinary: Negative for dysuria.  Musculoskeletal: Negative for back pain.  Skin: Negative for rash and wound.  Neurological: Negative for syncope.  Hematological: Does not bruise/bleed easily.     Physical Exam Updated Vital Signs BP 119/85   Pulse 87   Temp 97.7 F (36.5 C) (Oral)   Resp 18   Ht 1.778 m (5\' 10" )   Wt 104.3 kg   SpO2 100%   BMI 33.00 kg/m   Physical Exam  Constitutional: He is oriented to person, place, and time. He appears well-developed and well-nourished. No distress.  HENT:  Head: Normocephalic and atraumatic.  Mouth/Throat: Oropharynx is clear and moist.  Eyes: Pupils are equal, round, and reactive to light. Conjunctivae and EOM are normal.  Neck: Normal range of motion. Neck supple.  Cardiovascular: Normal rate, regular rhythm and normal heart sounds.  Pulmonary/Chest: Effort normal and breath sounds normal. No respiratory distress.  Abdominal: Soft. Bowel sounds are normal. There is no tenderness.  Genitourinary: Rectal exam shows guaiac positive stool.  Musculoskeletal: Normal range of motion.  Neurological: He  is alert and oriented to person, place, and time. No cranial nerve deficit or sensory deficit. He exhibits normal muscle tone. Coordination normal.  Skin: Skin is warm.  Nursing note and vitals reviewed.    ED Treatments / Results  Labs (all labs ordered are listed, but only abnormal results are displayed) Labs Reviewed  BASIC METABOLIC PANEL - Abnormal; Notable for the following components:      Result Value   Glucose, Bld 174 (*)    BUN 23 (*)    All other components within normal limits  CBC WITH DIFFERENTIAL/PLATELET  TYPE AND SCREEN    EKG None  Radiology No results found.  Procedures Procedures (including critical care time)  Medications Ordered in ED Medications  0.9 %  sodium chloride infusion (1,000 mLs Intravenous New Bag/Given 07/13/18 1422)  midazolam (VERSED) 5 MG/5ML injection (has no administration in time range)  ondansetron (ZOFRAN) 4 MG/2ML injection (has no administration in time range)  meperidine (DEMEROL) 50 MG/ML injection (has no administration in time range)  polyethylene glycol-electrolytes (NuLYTELY/GoLYTELY) solution 2,000 mL (2,000 mLs Oral Given 07/13/18 0942)     Initial Impression / Assessment and Plan / ED Course  I have reviewed the triage vital signs and the nursing notes.  Pertinent labs & imaging results that were available during my care of the patient were reviewed by me and considered in my medical decision making (see chart for details).      Patient hemodynamically stable hemoglobin stable here.  Seen by GI plans to do colonoscopy later today.  They did a partial bowel prep.  With that patient clearly had gross blood with his bowel movements.  However otherwise he remained stable.  Gastroenterology is planning to take him to the colonoscopy suite and redo the colonoscopy.  Options for bleeding include the polyps that were removed also has a history of diverticulosis.  Patient remained stable in the emergency department.   Discharged to the endoscopy suite.   Final Clinical Impressions(s) / ED Diagnoses   Final diagnoses:  Rectal bleeding    ED Discharge Orders    None       Fredia Sorrow, MD 07/13/18 [phone]

## 2018-07-13 NOTE — ED Notes (Signed)
Provider at bedside

## 2018-07-13 NOTE — Consult Note (Signed)
Referring Provider: ED Physician Primary Care Physician:  Mikey Kirschner, MD Primary Gastroenterologist:  Dr. Gala Romney  Date of Admission: 07/13/18 Date of Consultation: 07/13/18  Reason for Consultation:  GI Bleed  HPI:  Jeff Mccarthy is a 52 y.o. male with a past medical history of DM presented with complaints of lower GI bleed.  Patient underwent colonoscopy 5 days ago where and he had 3 polyps removed.  Yesterday he began having hematochezia.  He states he initially figured it was sloughing of the post polypectomy scar decided to just monitor it.  However, this became progressively worse and has had explosive bloody bowel movements.  His last episode was last night at 3 AM and had significant blood described as "a large amount."  He does admit to "gurgling stomach" and some lower back discomfort.  He is also had dizziness, lightheadedness, diaphoresis.  He denies dyspnea, chest pain, abdominal pain, nausea/vomiting, melena.  No other GI complaints.  His last meal was last night where he had a quarter of a chicken breast and a handful of Goldfish crackers.  He did have Gatorade this morning which was light blue ("Arctic ice").  Past Medical History:  Diagnosis Date  . Diabetes mellitus without complication [facility])     Past Surgical History:  Procedure Laterality Date  . COLONOSCOPY    . NECK SURGERY     C5, C6  . POLYPECTOMY      Prior to Admission medications   Not on File    No current facility-administered medications for this encounter.    No current outpatient medications on file.    Allergies as of 07/13/2018 - Review Complete 07/13/2018  Allergen Reaction Noted  . Biaxin [clarithromycin] Other (See Comments) 04/17/2015    History reviewed. No pertinent family history.  Social History   Socioeconomic History  . Marital status: Married    Spouse name: Not on file  . Number of children: Not on file  . Years of education: Not on file  . Highest education level: Not  on file  Occupational History  . Not on file  Social Needs  . Financial resource strain: Not on file  . Food insecurity:    Worry: Not on file    Inability: Not on file  . Transportation needs:    Medical: Not on file    Non-medical: Not on file  Tobacco Use  . Smoking status: Former Smoker    Last attempt to quit: 11/22/2014    Years since quitting: 3.6  . Smokeless tobacco: Never Used  Substance and Sexual Activity  . Alcohol use: No    Alcohol/week: 0.0 standard drinks  . Drug use: Not on file  . Sexual activity: Not on file  Lifestyle  . Physical activity:    Days per week: Not on file    Minutes per session: Not on file  . Stress: Not on file  Relationships  . Social connections:    Talks on phone: Not on file    Gets together: Not on file    Attends religious service: Not on file    Active member of club or organization: Not on file    Attends meetings of clubs or organizations: Not on file    Relationship status: Not on file  . Intimate partner violence:    Fear of current or ex partner: Not on file    Emotionally abused: Not on file    Physically abused: Not on file    Forced sexual  activity: Not on file  Other Topics Concern  . Not on file  Social History Narrative  . Not on file    Review of Systems: General: Negative for anorexia, weight loss, fever, chills. ENT: Negative for hoarseness, difficulty swallowing. CV: Negative for chest pain, angina, palpitations, dyspnea on exertion, peripheral edema.  Respiratory: Negative for dyspnea at rest, cough, sputum, wheezing.  GI: See history of present illness. Endo: Negative for unusual weight change.  Heme: Negative for bruising or bleeding. Allergy: Negative for rash or hives.  Physical Exam: Vital signs in last 24 hours: Temp:  [97.8 F (36.6 C)] 97.8 F (36.6 C) (08/21 0846) Pulse Rate:  [77-89] 84 (08/21 0846) Resp:  [17] 17 (08/21 0846) BP: (134)/(88) 134/88 (08/21 0846) SpO2:  [98 %-99 %] 99 %  (08/21 0846)   General:   Alert,  Well-developed, well-nourished, pleasant and cooperative in NAD Eyes:  Sclera clear, no icterus. Conjunctiva pink. Mild conjunctival pallor. Ears:  Normal auditory acuity. Mouth:  No deformity or lesions, dentition normal. Mild pallor noted. Neck:  Supple; no masses or thyromegaly. Lungs:  Clear throughout to auscultation. No wheezes, crackles, or rhonchi. No acute distress. Heart:  Regular rate and rhythm; no murmurs, clicks, rubs,  or gallops. Abdomen:  Soft, nontender and nondistended. No masses, hepatosplenomegaly or hernias noted. Normal bowel sounds, without guarding, and without rebound.   Rectal:  Deferred.   Msk:  Symmetrical without gross deformities. Pulses:  Normal bilateral DP pulses noted. Extremities:  Without clubbing or edema. Psych:  Alert and cooperative. Normal mood and affect.  Intake/Output from previous day: No intake/output data recorded. Intake/Output this shift: No intake/output data recorded.  Lab Results: No results for input(s): WBC, HGB, HCT, PLT in the last 72 hours. BMET No results for input(s): NA, K, CL, CO2, GLUCOSE, BUN, CREATININE, CALCIUM in the last 72 hours. LFT No results for input(s): PROT, ALBUMIN, AST, ALT, ALKPHOS, BILITOT, BILIDIR, IBILI in the last 72 hours. PT/INR No results for input(s): LABPROT, INR in the last 72 hours. Hepatitis Panel No results for input(s): HEPBSAG, HCVAB, HEPAIGM, HEPBIGM in the last 72 hours. C-Diff No results for input(s): CDIFFTOX in the last 72 hours.  Studies/Results: No results found.  Impression: Very pleasant 52 year old male with recent colonoscopy 5 days ago who developed progressively worsening hematochezia described as "large amount".  He does seem somewhat/mildly pale.  His vital signs are stable (blood pressure about 115/90, heart rate 80).  A CBC was ordered and just came back with a hemoglobin of 14.0, although there is some potential for this to decline  further..  Possible differentials include post polypectomy bleed, diverticular bleed, less likely benign anorectal source.  Given recent colonoscopy and significant multi-day progressive bleeding he will likely need to undergo colonoscopy to check for bleeding source and potential clipping of any polypectomy sites.  Discussed colonoscopy with him and he agrees to proceed.  Proceed with TCS with Dr. Gala Romney in near future: the risks, benefits, and alternatives have been discussed with the patient in detail. The patient states understanding and desires to proceed.  Patient is not on any anticoagulants, anxiolytics, chronic pain medications, or antidepressants.  Conscious sedation should be adequate for his procedure.  Plan: 1. N.p.o. for now 2. 2 L colon prep now 3. Tap water enema x2 on call to endoscopy 4. Monitor for recurrent bleeding 5. Monitor hemoglobin 6. Further recommendations to be made after his colonoscopy 7. Supportive measures   Thank you for allowing [place] to participate  in the care of Gualberto Jory Sims, DNP, AGNP-C Adult & Gerontological Nurse Practitioner [facility]    LOS: 0 days     07/13/2018, 9:07 AM

## 2018-07-13 NOTE — Discharge Instructions (Signed)
°  Colonoscopy Discharge Instructions  Read the instructions outlined below and refer to this sheet in the next few weeks. These discharge instructions provide you with general information on caring for yourself after you leave the hospital. Your doctor may also give you specific instructions. While your treatment has been planned according to the most current medical practices available, unavoidable complications occasionally occur. If you have any problems or questions after discharge, call Dr. Gala Romney at [phone]. ACTIVITY  You may resume your regular activity, but move at a slower pace for the next 24 hours.   Take frequent rest periods for the next 24 hours.   Walking will help get rid of the air and reduce the bloated feeling in your belly (abdomen).   No driving for 24 hours (because of the medicine (anesthesia) used during the test).    Do not sign any important legal documents or operate any machinery for 24 hours (because of the anesthesia used during the test).  NUTRITION  Drink plenty of fluids.   You may resume your normal diet as instructed by your doctor.   Begin with a light meal and progress to your normal diet. Heavy or fried foods are harder to digest and may make you feel sick to your stomach (nauseated).   Avoid alcoholic beverages for 24 hours or as instructed.  MEDICATIONS  You may resume your normal medications unless your doctor tells you otherwise.  WHAT YOU CAN EXPECT TODAY  Some feelings of bloating in the abdomen.   Passage of more gas than usual.   Spotting of blood in your stool or on the toilet paper.  IF YOU HAD POLYPS REMOVED DURING THE COLONOSCOPY:  No aspirin products for 7 days or as instructed.   No alcohol for 7 days or as instructed.   Eat a soft diet for the next 24 hours.  FINDING OUT THE RESULTS OF YOUR TEST Not all test results are available during your visit. If your test results are not back during the visit, make an appointment  with your caregiver to find out the results. Do not assume everything is normal if you have not heard from your caregiver or the medical facility. It is important for you to follow up on all of your test results.  SEEK IMMEDIATE MEDICAL ATTENTION IF:  You have more than a spotting of blood in your stool.   Your belly is swollen (abdominal distention).   You are nauseated or vomiting.   You have a temperature over 101.   You have abdominal pain or discomfort that is severe or gets worse throughout the day.     Repeat colonoscopy in 5 years  No future MRI until clips are known to have passed.  Call me with any problems cell: [phone]

## 2018-07-13 NOTE — ED Triage Notes (Signed)
Had colonoscopy with polypectomy Friday. Had dark red bloody stool yesterday. Has had 10 more bloody stools since. Pt denies pain. Wife states pt was pale and diaphoretic yesterday. Pt does not appear pale or diaphoretic today. nad

## 2018-07-13 NOTE — Progress Notes (Signed)
Patient seen and examined in endoscopy He remains hemodynamically stable. H&H of 14/39.9 earlier today.  Anticipate therapeutic colonoscopy with subsequent D/C home.  The risks, benefits, limitations, alternatives and imponderables have been reviewed with the patient. Questions have been answered. All parties are agreeable.

## 2018-07-13 NOTE — Telephone Encounter (Signed)
RMR pt called at 8:11 AM, stating that he has filled the toilet up with blood 9 times since we spoke yesterday. Last night pt felt lightheaded and sweated profusely. Pt feels terrible this morning. Pt was advised to have his spouse drive him to the ED for evaluation. Pt agreed and is on his way to the ED.

## 2018-07-13 NOTE — Telephone Encounter (Signed)
Communication noted.  

## 2018-07-18 ENCOUNTER — Encounter (HOSPITAL_COMMUNITY): Payer: Self-pay | Admitting: Internal Medicine

## 2018-07-26 ENCOUNTER — Encounter: Payer: Self-pay | Admitting: Family Medicine

## 2018-07-26 ENCOUNTER — Ambulatory Visit (INDEPENDENT_AMBULATORY_CARE_PROVIDER_SITE_OTHER): Payer: PRIVATE HEALTH INSURANCE | Admitting: Family Medicine

## 2018-07-26 VITALS — BP 124/88 | Ht 70.0 in | Wt 229.0 lb

## 2018-07-26 DIAGNOSIS — Z0000 Encounter for general adult medical examination without abnormal findings: Secondary | ICD-10-CM | POA: Diagnosis not present

## 2018-07-26 DIAGNOSIS — R7309 Other abnormal glucose: Secondary | ICD-10-CM | POA: Diagnosis not present

## 2018-07-26 LAB — POCT GLYCOSYLATED HEMOGLOBIN (HGB A1C): Hemoglobin A1C: 5 % (ref 4.0–5.6)

## 2018-07-26 MED ORDER — ATORVASTATIN CALCIUM 20 MG PO TABS: 20.0000 mg | ORAL_TABLET | Freq: Every day | ORAL | 1 refills | Status: DC

## 2018-07-26 NOTE — Progress Notes (Signed)
   Subjective:    Patient ID: Jeff Mccarthy, male    DOB: 1966/09/20, 52 y.o.   MRN: [id number]  HPI The patient comes in today for a wellness visit.    A review of their health history was completed.  A review of medications was also completed.  Any needed refills; Yes Lipitor  Eating habits: Not that great  Falls/  MVA accidents in past few months: No  Regular exercise: Yes  Specialist pt sees on regular basis: No  Preventative health issues were discussed.   Additional concerns: None. He states he just had a colonoscopy done two weeks ago.He also states he will receive his flu shot through work. He had a bleed after the removal of some polyps after the colonoscopy.    Diet good, eatong habits still pretty good  Patient continues to take lipid medication regularly. No obvious side effects from it. Generally does not miss a dose. Prior blood work results are reviewed with patient. Patient continues to work on fat intake in diet    exersing overall really good     Review of Systems Results for orders placed or performed in visit on 07/26/18  POCT glycosylated hemoglobin (Hb A1C)  Result Value Ref Range   Hemoglobin A1C 5.0 4.0 - 5.6 %   HbA1c POC (<> result, manual entry)     HbA1c, POC (prediabetic range)     HbA1c, POC (controlled diabetic range)     No headache, no major weight loss or weight gain, no chest pain no back pain abdominal pain no change in bowel habits complete ROS otherwise negative     Objective:   Physical Exam  Constitutional: He appears well-developed and well-nourished.  HENT:  Head: Normocephalic and atraumatic.  Right Ear: External ear normal.  Left Ear: External ear normal.  Nose: Nose normal.  Mouth/Throat: Oropharynx is clear and moist.  Eyes: Pupils are equal, round, and reactive to light. EOM are normal.  Neck: Normal range of motion. Neck supple. No thyromegaly present.  Cardiovascular: Normal rate, regular rhythm and normal  heart sounds.  No murmur heard. Pulmonary/Chest: Effort normal and breath sounds normal. No respiratory distress. He has no wheezes.  Abdominal: Soft. Bowel sounds are normal. He exhibits no distension and no mass. There is no tenderness.  Genitourinary: Penis normal.  Genitourinary Comments: Prost wnl     Musculoskeletal: Normal range of motion. He exhibits no edema.  Lymphadenopathy:    He has no cervical adenopathy.  Neurological: He is alert. He exhibits normal muscle tone.  Skin: Skin is warm and dry. No erythema.  Psychiatric: He has a normal mood and affect. His behavior is normal. Judgment normal.  Vitals reviewed.         Assessment & Plan:  Impression wellness exam.  Just had colonoscopy.  Diet discussed.  Exercise discussed.  Type 2 diabetes.  Control excellent patient's own efforts.  A1c great  Hyperlipidemia numbers excellent  Diet exercise discussed and medication.  Patient received flu shot through workplace.  Of note PSA excellent on this year's wellness blood work with the insurance company

## 2019-01-26 ENCOUNTER — Encounter: Payer: Self-pay | Admitting: Family Medicine

## 2019-01-26 ENCOUNTER — Ambulatory Visit: Payer: PRIVATE HEALTH INSURANCE | Admitting: Family Medicine

## 2019-01-26 VITALS — BP 136/90 | Ht 70.0 in | Wt 232.0 lb

## 2019-01-26 DIAGNOSIS — Z79899 Other long term (current) drug therapy: Secondary | ICD-10-CM

## 2019-01-26 DIAGNOSIS — Z125 Encounter for screening for malignant neoplasm of prostate: Secondary | ICD-10-CM

## 2019-01-26 DIAGNOSIS — E119 Type 2 diabetes mellitus without complications: Secondary | ICD-10-CM

## 2019-01-26 DIAGNOSIS — E785 Hyperlipidemia, unspecified: Secondary | ICD-10-CM | POA: Diagnosis not present

## 2019-01-26 LAB — POCT GLYCOSYLATED HEMOGLOBIN (HGB A1C): HEMOGLOBIN A1C: 5.5 % (ref 4.0–5.6)

## 2019-01-26 MED ORDER — ATORVASTATIN CALCIUM 20 MG PO TABS: 20.0000 mg | ORAL_TABLET | Freq: Every day | ORAL | 1 refills | Status: DC

## 2019-01-26 NOTE — Progress Notes (Signed)
   Subjective:    Patient ID: Jeff Mccarthy, male    DOB: [birth year], 53 y.o.   MRN: [id number]  Diabetes  He presents for his follow-up diabetic visit. There are no hypoglycemic associated symptoms. There are no diabetic associated symptoms. There are no hypoglycemic complications. There are no diabetic complications. He does not see a podiatrist.Eye exam is not current.  Hyperlipidemia  This is a chronic problem. There are no compliance problems.   exercise off and on   No fever no gunkinees   No head zhe    Results for orders placed or performed in visit on 01/26/19  POCT HgB A1C  Result Value Ref Range   Hemoglobin A1C 5.5 4.0 - 5.6 %   HbA1c POC (<> result, manual entry)     HbA1c, POC (prediabetic range)     HbA1c, POC (controlled diabetic range)      Pt has been taking Sudafed for about 2 weeks for seasonal allergies.Pt is having cough and stuffy nose.   Results for orders placed or performed in visit on 01/26/19  POCT HgB A1C  Result Value Ref Range   Hemoglobin A1C 5.5 4.0 - 5.6 %   HbA1c POC (<> result, manual entry)     HbA1c, POC (prediabetic range)     HbA1c, POC (controlled diabetic range)     Review of Systems No headache, no major weight loss or weight gain, no chest pain no back pain abdominal pain no change in bowel habits complete ROS otherwise negative     Objective:   Physical Exam  Alert vitals stable, NAD. Blood pressure good on repeat. HEENT normal. Lungs clear. Heart regular rate and rhythm.       Assessment & Plan:  Impression type 2 diabetes.  Good control discussed to maintain same efforts and diet  2.  Hyperlipidemia  Good control discussed maintain same meds diet exercise discussed appropriate blood work

## 2019-07-19 LAB — HEPATIC FUNCTION PANEL
ALT: 18 IU/L (ref 0–44)
AST: 18 IU/L (ref 0–40)
Albumin: 4.9 g/dL (ref 3.8–4.9)
Alkaline Phosphatase: 51 IU/L (ref 39–117)
Bilirubin Total: 0.7 mg/dL (ref 0.0–1.2)
Bilirubin, Direct: 0.23 mg/dL (ref 0.00–0.40)
Total Protein: 7.2 g/dL (ref 6.0–8.5)

## 2019-07-19 LAB — LIPID PANEL
Chol/HDL Ratio: 4 ratio (ref 0.0–5.0)
Cholesterol, Total: 156 mg/dL (ref 100–199)
HDL: 39 mg/dL — ABNORMAL LOW (ref >39)
LDL Calculated: 60 mg/dL (ref 0–99)
Triglycerides: 283 mg/dL — ABNORMAL HIGH (ref 0–149)
VLDL Cholesterol Cal: 57 mg/dL — ABNORMAL HIGH (ref 5–40)

## 2019-07-19 LAB — PSA: Prostate Specific Ag, Serum: 0.5 ng/mL (ref 0.0–4.0)

## 2019-08-02 ENCOUNTER — Other Ambulatory Visit: Payer: Self-pay

## 2019-08-02 ENCOUNTER — Encounter: Payer: Self-pay | Admitting: Family Medicine

## 2019-08-02 ENCOUNTER — Ambulatory Visit (INDEPENDENT_AMBULATORY_CARE_PROVIDER_SITE_OTHER): Payer: PRIVATE HEALTH INSURANCE | Admitting: Family Medicine

## 2019-08-02 VITALS — BP 122/82 | Temp 97.2°F | Ht 70.0 in | Wt 229.0 lb

## 2019-08-02 DIAGNOSIS — Z0000 Encounter for general adult medical examination without abnormal findings: Secondary | ICD-10-CM

## 2019-08-02 MED ORDER — ATORVASTATIN CALCIUM 20 MG PO TABS: 20.0000 mg | ORAL_TABLET | Freq: Every day | ORAL | 1 refills | Status: DC

## 2019-08-02 NOTE — Progress Notes (Signed)
Subjective:    Patient ID: Jeff Mccarthy, male    DOB: 1966-08-20, 53 y.o.   MRN: [id number]  HPI  The patient comes in today for a wellness visit.    A review of their health history was completed.  A review of medications was also completed.  Any needed refills; yes  Eating habits: eating pretty good  Falls/  MVA accidents in past few months: none  Regular exercise: not as much during covid  Specialist pt sees on regular basis: none  Preventative health issues were discussed.   Additional concerns: none  Results for orders placed or performed in visit on 01/26/19  Lipid Profile  Result Value Ref Range   Cholesterol, Total 156 100 - 199 mg/dL   Triglycerides 283 (H) 0 - 149 mg/dL   HDL 39 (L) >39 mg/dL   VLDL Cholesterol Cal 57 (H) 5 - 40 mg/dL   LDL Calculated 60 0 - 99 mg/dL   Chol/HDL Ratio 4.0 0.0 - 5.0 ratio  Hepatic function panel  Result Value Ref Range   Total Protein 7.2 6.0 - 8.5 g/dL   Albumin 4.9 3.8 - 4.9 g/dL   Bilirubin Total 0.7 0.0 - 1.2 mg/dL   Bilirubin, Direct 0.23 0.00 - 0.40 mg/dL   Alkaline Phosphatase 51 39 - 117 IU/L   AST 18 0 - 40 IU/L   ALT 18 0 - 44 IU/L  PSA  Result Value Ref Range   Prostate Specific Ag, Serum 0.5 0.0 - 4.0 ng/mL  POCT HgB A1C  Result Value Ref Range   Hemoglobin A1C 5.5 4.0 - 5.6 %   HbA1c POC (<> result, manual entry)     HbA1c, POC (prediabetic range)     HbA1c, POC (controlled diabetic range)     Five yr pas on the cookn    Not exercising the best, has fell off a bit   Not quarantining very well     Does not ck sugars curently  Flu shot guy ususally   No  diab  isues on the eye doc visit      Review of Systems  Constitutional: Negative for activity change, appetite change and fever.  HENT: Negative for congestion and rhinorrhea.   Eyes: Negative for discharge.  Respiratory: Negative for cough and wheezing.   Cardiovascular: Negative for chest pain.  Gastrointestinal: Negative for  abdominal pain, blood in stool and vomiting.  Genitourinary: Negative for difficulty urinating and frequency.  Musculoskeletal: Negative for neck pain.  Skin: Negative for rash.  Allergic/Immunologic: Negative for environmental allergies and food allergies.  Neurological: Negative for weakness and headaches.  Psychiatric/Behavioral: Negative for agitation.  All other systems reviewed and are negative.      Objective:   Physical Exam Vitals signs reviewed.  Constitutional:      Appearance: He is well-developed.  HENT:     Head: Normocephalic and atraumatic.     Right Ear: External ear normal.     Left Ear: External ear normal.     Nose: Nose normal.  Eyes:     Pupils: Pupils are equal, round, and reactive to light.  Neck:     Musculoskeletal: Normal range of motion and neck supple.     Thyroid: No thyromegaly.  Cardiovascular:     Rate and Rhythm: Normal rate and regular rhythm.     Heart sounds: Normal heart sounds. No murmur.  Pulmonary:     Effort: Pulmonary effort is normal. No respiratory distress.  Breath sounds: Normal breath sounds. No wheezing.  Abdominal:     General: Bowel sounds are normal. There is no distension.     Palpations: Abdomen is soft. There is no mass.     Tenderness: There is no abdominal tenderness.  Genitourinary:    Penis: Normal.   Musculoskeletal: Normal range of motion.  Lymphadenopathy:     Cervical: No cervical adenopathy.  Skin:    General: Skin is warm and dry.     Findings: No erythema.  Neurological:     Mental Status: He is alert.     Motor: No abnormal muscle tone.  Psychiatric:        Behavior: Behavior normal.        Judgment: Judgment normal.           Assessment & Plan:  Impression well adult exam.  Up-to-date on colonoscopy.  Diet discussed.  Exercise discussed.  Gets flu shots elsewhere.  Lipid management shows excellent control.  Medications refilled follow-up in 6 months.  Patient also type II diabetic.  A1c  excellent at last visit

## 2019-12-27 ENCOUNTER — Encounter: Payer: Self-pay | Admitting: Family Medicine

## 2020-01-30 ENCOUNTER — Other Ambulatory Visit: Payer: Self-pay

## 2020-01-30 ENCOUNTER — Ambulatory Visit (INDEPENDENT_AMBULATORY_CARE_PROVIDER_SITE_OTHER): Payer: PRIVATE HEALTH INSURANCE | Admitting: Family Medicine

## 2020-01-30 DIAGNOSIS — Z79899 Other long term (current) drug therapy: Secondary | ICD-10-CM | POA: Diagnosis not present

## 2020-01-30 DIAGNOSIS — E119 Type 2 diabetes mellitus without complications: Secondary | ICD-10-CM

## 2020-01-30 DIAGNOSIS — E785 Hyperlipidemia, unspecified: Secondary | ICD-10-CM | POA: Diagnosis not present

## 2020-01-30 MED ORDER — ATORVASTATIN CALCIUM 20 MG PO TABS: 20.0000 mg | ORAL_TABLET | Freq: Every day | ORAL | 1 refills | Status: DC

## 2020-01-30 NOTE — Progress Notes (Signed)
   Subjective:  Audio video  Patient ID: Jeff Mccarthy, male    DOB: [birth year], 55 y.o.   MRN: [id number]  Hyperlipidemia This is a chronic problem. Treatments tried: atorvastatin 20. Compliance problems: eats healthy, exercises, takes med most days.    Pt states no concerns today.   Virtual Visit via Telephone Note  I connected with Jeff Mccarthy on 01/30/20 at  8:30 AM EST by telephone and verified that I am speaking with the correct person using two identifiers.  Location: Patient: home Provider: office   I discussed the limitations, risks, security and privacy concerns of performing an evaluation and management service by telephone and the availability of in person appointments. I also discussed with the patient that there may be a patient responsible charge related to this service. The patient expressed understanding and agreed to proceed.   History of Present Illness:    Observations/Objective: Results for orders placed or performed in visit on 01/26/19  Lipid Profile  Result Value Ref Range   Cholesterol, Total 156 100 - 199 mg/dL   Triglycerides 283 (H) 0 - 149 mg/dL   HDL 39 (L) >39 mg/dL   VLDL Cholesterol Cal 57 (H) 5 - 40 mg/dL   LDL Calculated 60 0 - 99 mg/dL   Chol/HDL Ratio 4.0 0.0 - 5.0 ratio  Hepatic function panel  Result Value Ref Range   Total Protein 7.2 6.0 - 8.5 g/dL   Albumin 4.9 3.8 - 4.9 g/dL   Bilirubin Total 0.7 0.0 - 1.2 mg/dL   Bilirubin, Direct 0.23 0.00 - 0.40 mg/dL   Alkaline Phosphatase 51 39 - 117 IU/L   AST 18 0 - 40 IU/L   ALT 18 0 - 44 IU/L  PSA  Result Value Ref Range   Prostate Specific Ag, Serum 0.5 0.0 - 4.0 ng/mL  POCT HgB A1C  Result Value Ref Range   Hemoglobin A1C 5.5 4.0 - 5.6 %   HbA1c POC (<> result, manual entry)     HbA1c, POC (prediabetic range)     HbA1c, POC (controlled diabetic range)       Assessment and Plan:   Follow Up Instructions:    I discussed the assessment and treatment plan with the patient.  The patient was provided an opportunity to ask questions and all were answered. The patient agreed with the plan and demonstrated an understanding of the instructions.   The patient was advised to call back or seek an in-person evaluation if the symptoms worsen or if the condition fails to improve as anticipated.  I provided 21 minutes of non-face-to-face time during this encounter.  Patient claims compliance with diabetes medication. No obvious side effects. Reports no substantial low sugar spells. Most numbers are generally in good range when checked fasting. Generally does not miss a dose of medication. Watching diabetic diet closely  Patient continues to take lipid medication regularly. No obvious side effects from it. Generally does not miss a dose. Prior blood work results are reviewed with patient. Patient continues to work on fat intake in diet   No headache no chest pain no shortness of breath Review of Systems    Objective:   Physical Exam  Virtual      Assessment & Plan:  Impression type 2 diabetes.  Apparent excellent control.  Continues exercise watching his diet  2.  Hyperlipidemia status uncertain need to check blood work  Follow-up in 6 months diet exercise discussed

## 2020-09-25 ENCOUNTER — Encounter: Payer: Self-pay | Admitting: Family Medicine

## 2020-09-25 ENCOUNTER — Other Ambulatory Visit: Payer: Self-pay

## 2020-09-25 ENCOUNTER — Ambulatory Visit (INDEPENDENT_AMBULATORY_CARE_PROVIDER_SITE_OTHER): Payer: PRIVATE HEALTH INSURANCE | Admitting: Family Medicine

## 2020-09-25 VITALS — BP 128/92 | HR 96 | Temp 97.9°F | Wt 233.0 lb

## 2020-09-25 DIAGNOSIS — Z0000 Encounter for general adult medical examination without abnormal findings: Secondary | ICD-10-CM

## 2020-09-25 DIAGNOSIS — L989 Disorder of the skin and subcutaneous tissue, unspecified: Secondary | ICD-10-CM | POA: Diagnosis not present

## 2020-09-25 DIAGNOSIS — E785 Hyperlipidemia, unspecified: Secondary | ICD-10-CM

## 2020-09-25 DIAGNOSIS — E119 Type 2 diabetes mellitus without complications: Secondary | ICD-10-CM | POA: Diagnosis not present

## 2020-09-25 LAB — POCT GLYCOSYLATED HEMOGLOBIN (HGB A1C): Hemoglobin A1C: 6.3 % — AB (ref 4.0–5.6)

## 2020-09-25 MED ORDER — ATORVASTATIN CALCIUM 20 MG PO TABS: 20.0000 mg | ORAL_TABLET | Freq: Every day | ORAL | 1 refills | Status: DC

## 2020-09-25 NOTE — Progress Notes (Signed)
 Patient ID: Jeff Mccarthy, male    DOB: 04/07/1966, 54 y.o.   MRN: [id number]   Chief Complaint  Patient presents with  . Annual Exam   Subjective:    HPI  The patient comes in today for a wellness visit.    A review of their health history was completed.  A review of medications was also completed.  Any needed refills: Lipitor   Eating habits: good   Falls/  MVA accidents in past few months: none  Regular exercise: walking  Specialist pt sees on regular basis: none  Preventative health issues were discussed.   Additional concerns: small bump on top of head.  Taking sudafed and was elevated bp. Having some congestion. Checking bp regularly at work and its regular, 124-128/82-84.  3-4 yrs ago had 8/1 alc, then diet and exercise helped and came down to 5-5.5. Worked on diet modification. Saw DM educator.  HLD- doing well, compliant with meds.  Denies myalgias. Taking lipitor daily.   Medical History Romano has a past medical history of Diabetes mellitus without complication (HCC).   Outpatient Encounter Medications as of 09/25/2020  Medication Sig  . atorvastatin (LIPITOR) 20 MG tablet Take 1 tablet (20 mg total) by mouth daily.  . [DISCONTINUED] atorvastatin (LIPITOR) 20 MG tablet Take 1 tablet (20 mg total) by mouth daily.  . [DISCONTINUED] loratadine (CLARITIN) 10 MG tablet Take 10 mg by mouth daily.   No facility-administered encounter medications on file as of 09/25/2020.     Review of Systems  Constitutional: Negative for chills and fever.  HENT: Negative for congestion, rhinorrhea and sore throat.   Respiratory: Negative for cough, shortness of breath and wheezing.   Cardiovascular: Negative for chest pain and leg swelling.  Gastrointestinal: Negative for abdominal pain, diarrhea, nausea and vomiting.  Genitourinary: Negative for dysuria and frequency.  Skin: Negative for rash.       +skin lesion-scalp  Neurological: Negative for dizziness, weakness  and headaches.     Vitals BP (!) 128/92   Pulse 96   Temp 97.9 F (36.6 C)   Wt 233 lb (105.7 kg)   SpO2 99%   BMI 33.43 kg/m   Objective:   Physical Exam Vitals and nursing note reviewed.  Constitutional:      General: He is not in acute distress.    Appearance: Normal appearance. He is not ill-appearing.  HENT:     Head: Normocephalic.     Comments: +0.5cm, skin colored lesion on top of scalp. No erythema, fluctuance, or tenderness. No bleeding or flaking.    Right Ear: Tympanic membrane normal.     Left Ear: Tympanic membrane normal.     Nose: Nose normal. No congestion.     Mouth/Throat:     Mouth: Mucous membranes are moist.     Pharynx: No oropharyngeal exudate.  Eyes:     Extraocular Movements: Extraocular movements intact.     Conjunctiva/sclera: Conjunctivae normal.     Pupils: Pupils are equal, round, and reactive to light.  Cardiovascular:     Rate and Rhythm: Normal rate and regular rhythm.     Pulses: Normal pulses.     Heart sounds: Normal heart sounds. No murmur heard.   Pulmonary:     Effort: Pulmonary effort is normal.     Breath sounds: Normal breath sounds. No wheezing, rhonchi or rales.  Musculoskeletal:        General: Normal range of motion.     Right lower leg: No   edema.     Left lower leg: No edema.  Skin:    General: Skin is warm and dry.     Findings: No rash.  Neurological:     General: No focal deficit present.     Mental Status: He is alert and oriented to person, place, and time.     Cranial Nerves: No cranial nerve deficit.  Psychiatric:        Mood and Affect: Mood normal.        Behavior: Behavior normal.        Thought Content: Thought content normal.        Judgment: Judgment normal.      Assessment and Plan   1. Type 2 diabetes mellitus without complication, without long-term current use of insulin (HCC) - Microalbumin, urine - POCT glycosylated hemoglobin (Hb A1C)  2. Skin lesion of scalp - Ambulatory referral  to Dermatology  3. Laboratory tests ordered as part of a complete physical exam (CPE) - CBC - CMP14+EGFR - Lipid panel - PSA; Future  4. Hyperlipidemia, unspecified hyperlipidemia type - atorvastatin (LIPITOR) 20 MG tablet; Take 1 tablet (20 mg total) by mouth daily.  Dispense: 90 tablet; Refill: 1   DM2- Last Eye exam diab in 2019. Pt overdue.  Pt stating he will call for appt. Needing to see opthalmology due to having far sightedness. a1c- checked today elevated at 6.3.  Cont to watch diet and increase in exercising.  Elevated bp- pt to dec salt. Cont to monitor.  Pt is able to check bp at work.  Stating more in 120/80s. Pt stating he took sudafed last few days and has elevated bp today. Pt to check his bp at home and call if seeing bp over 150/85 or above.  New skin lesion scalp- Small skin lesion on top of head. Referral to derm.  hld- stable. Labs checked today.  Cont meds.  Pt to get labs after appt  F/u 6mo or prn.   

## 2020-09-26 LAB — MICROALBUMIN, URINE: Microalbumin, Urine: 15.9 ug/mL

## 2020-09-26 LAB — LIPID PANEL
Chol/HDL Ratio: 4.2 ratio (ref 0.0–5.0)
Cholesterol, Total: 167 mg/dL (ref 100–199)
HDL: 40 mg/dL (ref >39)
LDL Chol Calc (NIH): 82 mg/dL (ref 0–99)
Triglycerides: 275 mg/dL — ABNORMAL HIGH (ref 0–149)
VLDL Cholesterol Cal: 45 mg/dL — ABNORMAL HIGH (ref 5–40)

## 2020-09-26 LAB — CMP14+EGFR
ALT: 49 IU/L — ABNORMAL HIGH (ref 0–44)
AST: 33 IU/L (ref 0–40)
Albumin/Globulin Ratio: 2.2 (ref 1.2–2.2)
Albumin: 5.4 g/dL — ABNORMAL HIGH (ref 3.8–4.9)
Alkaline Phosphatase: 66 IU/L (ref 44–121)
BUN/Creatinine Ratio: 20 (ref 9–20)
BUN: 17 mg/dL (ref 6–24)
Bilirubin Total: 0.5 mg/dL (ref 0.0–1.2)
CO2: 25 mmol/L (ref 20–29)
Calcium: 9.7 mg/dL (ref 8.7–10.2)
Chloride: 98 mmol/L (ref 96–106)
Creatinine, Ser: 0.85 mg/dL (ref 0.76–1.27)
GFR calc Af Amer: 115 mL/min/{1.73_m2} (ref >59)
GFR calc non Af Amer: 99 mL/min/{1.73_m2} (ref >59)
Globulin, Total: 2.5 g/dL (ref 1.5–4.5)
Glucose: 102 mg/dL — ABNORMAL HIGH (ref 65–99)
Potassium: 4.4 mmol/L (ref 3.5–5.2)
Sodium: 139 mmol/L (ref 134–144)
Total Protein: 7.9 g/dL (ref 6.0–8.5)

## 2020-09-26 LAB — CBC
Hematocrit: 52.2 % — ABNORMAL HIGH (ref 37.5–51.0)
Hemoglobin: 18.3 g/dL — ABNORMAL HIGH (ref 13.0–17.7)
MCH: 31.8 pg (ref 26.6–33.0)
MCHC: 35.1 g/dL (ref 31.5–35.7)
MCV: 91 fL (ref 79–97)
Platelets: 182 10*3/uL (ref 150–450)
RBC: 5.75 x10E6/uL (ref 4.14–5.80)
RDW: 12.5 % (ref 11.6–15.4)
WBC: 8.8 10*3/uL (ref 3.4–10.8)

## 2020-10-06 ENCOUNTER — Encounter: Payer: Self-pay | Admitting: Family Medicine

## 2021-03-25 ENCOUNTER — Ambulatory Visit: Payer: PRIVATE HEALTH INSURANCE | Admitting: Family Medicine

## 2021-04-03 ENCOUNTER — Ambulatory Visit (INDEPENDENT_AMBULATORY_CARE_PROVIDER_SITE_OTHER): Payer: PRIVATE HEALTH INSURANCE | Admitting: Family Medicine

## 2021-04-03 ENCOUNTER — Other Ambulatory Visit: Payer: Self-pay

## 2021-04-03 VITALS — BP 128/90 | HR 91 | Temp 97.3°F | Ht 70.0 in | Wt 238.0 lb

## 2021-04-03 DIAGNOSIS — M2550 Pain in unspecified joint: Secondary | ICD-10-CM

## 2021-04-03 DIAGNOSIS — E785 Hyperlipidemia, unspecified: Secondary | ICD-10-CM | POA: Diagnosis not present

## 2021-04-03 DIAGNOSIS — E119 Type 2 diabetes mellitus without complications: Secondary | ICD-10-CM

## 2021-04-03 MED ORDER — ATORVASTATIN CALCIUM 20 MG PO TABS: 20.0000 mg | ORAL_TABLET | Freq: Every day | ORAL | 1 refills | Status: DC

## 2021-04-03 NOTE — Progress Notes (Signed)
Patient ID: Jeff Mccarthy, male    DOB: 06-27-1966, 55 y.o.   MRN: [id number]   Chief Complaint  Patient presents with  . Hyperlipidemia    Follow up - body aches with statins at times  . Diabetes   Subjective:    HPI  Dm2- Compliant with medications. Checking blood glucose.   Not seeing any high or low numbers.  Denies polyuria or polydipsia.  Eye exam: overdue. Pt stating will call. Foot exam: no concerns.  Hb- currently smoking 1/2 ppd.  Smoking for over 20 yrs.  Was smoking more with working. Retired- 1.5 yrs ago.  No foot concerns.  Pt wondering if his lipitor might be causing some generalized body aches/joint pains or if it's just "getting older."  He does recall not having his cholesterol medication for 10 days when he forgot it on vacation.  Didn't change his symptoms. No particular joint or muscle group bothering him.  Just generalized body aches and pains.  Medical History Jeff Mccarthy has a past medical history of Diabetes mellitus without complication ([place]).   Outpatient Encounter Medications as of 04/03/2021  Medication Sig  . fluticasone (FLONASE) 50 MCG/ACT nasal spray Place into both nostrils daily.  . pseudoephedrine (SUDAFED) 30 MG tablet Take 30 mg by mouth every 4 (four) hours as needed for congestion.  Marland Kitchen atorvastatin (LIPITOR) 20 MG tablet Take 1 tablet (20 mg total) by mouth daily.  . [DISCONTINUED] atorvastatin (LIPITOR) 20 MG tablet Take 1 tablet (20 mg total) by mouth daily.   No facility-administered encounter medications on file as of 04/03/2021.     Review of Systems  Constitutional: Negative for chills and fever.  HENT: Negative for congestion, rhinorrhea and sore throat.   Respiratory: Negative for cough, shortness of breath and wheezing.   Cardiovascular: Negative for chest pain and leg swelling.  Gastrointestinal: Negative for abdominal pain, diarrhea, nausea and vomiting.  Genitourinary: Negative for dysuria and frequency.   Musculoskeletal: Positive for arthralgias (generalized).  Skin: Negative for rash.  Neurological: Negative for dizziness, weakness and headaches.     Vitals BP 128/90   Pulse 91   Temp (!) 97.3 F (36.3 C)   Ht '5\' 10"'  (1.778 m)   Wt 238 lb (108 kg)   SpO2 99%   BMI 34.15 kg/m   Objective:   Physical Exam Vitals and nursing note reviewed.  Constitutional:      General: He is not in acute distress.    Appearance: Normal appearance. He is not ill-appearing.  HENT:     Head: Normocephalic.     Nose: Nose normal. No congestion.     Mouth/Throat:     Mouth: Mucous membranes are moist.     Pharynx: No oropharyngeal exudate.  Eyes:     Extraocular Movements: Extraocular movements intact.     Conjunctiva/sclera: Conjunctivae normal.     Pupils: Pupils are equal, round, and reactive to light.  Cardiovascular:     Rate and Rhythm: Normal rate and regular rhythm.     Pulses: Normal pulses.     Heart sounds: Normal heart sounds. No murmur heard.   Pulmonary:     Effort: Pulmonary effort is normal.     Breath sounds: Normal breath sounds. No wheezing, rhonchi or rales.  Musculoskeletal:        General: Normal range of motion.     Right lower leg: No edema.     Left lower leg: No edema.  Skin:    General: Skin is  warm and dry.     Findings: No rash.  Neurological:     General: No focal deficit present.     Mental Status: He is alert and oriented to person, place, and time.     Cranial Nerves: No cranial nerve deficit.  Psychiatric:        Mood and Affect: Mood normal.        Behavior: Behavior normal.        Thought Content: Thought content normal.        Judgment: Judgment normal.      Assessment and Plan   1. Type 2 diabetes mellitus without complication, without long-term current use of insulin (HCC) - CBC - CMP14+EGFR - Lipid panel - Microalbumin, urine - Hemoglobin A1c  2. Hyperlipidemia, unspecified hyperlipidemia type - Lipid panel - atorvastatin  (LIPITOR) 20 MG tablet; Take 1 tablet (20 mg total) by mouth daily.  Dispense: 90 tablet; Refill: 1  3. Arthralgia, unspecified joint - CK; Future    Arthralgia- generalized. will order ck and cmp to check to see if having side effects from statin.  Likely not related since recent trip for 10 days w/o medications didn't change his body pains.  Elevated bp w/o dx htn- diastolic bp elevated again, pt stating taking sudafed, advised pt not to take this for sinus congetsions and to take allergy meds- claritin or allegra and use flonase.  Or coricidin hbp.    DM2-  Ordered labs, diet modification.  Pt over due on eye exam.  Pt to call to get this ordered.  Return in about 6 months (around 10/04/2021) for f/u htn, hld, dm2.

## 2021-04-04 LAB — CBC
Hematocrit: 48.3 % (ref 37.5–51.0)
Hemoglobin: 17 g/dL (ref 13.0–17.7)
MCH: 32.2 pg (ref 26.6–33.0)
MCHC: 35.2 g/dL (ref 31.5–35.7)
MCV: 92 fL (ref 79–97)
Platelets: 199 10*3/uL (ref 150–450)
RBC: 5.28 x10E6/uL (ref 4.14–5.80)
RDW: 12.4 % (ref 11.6–15.4)
WBC: 8.4 10*3/uL (ref 3.4–10.8)

## 2021-04-04 LAB — LIPID PANEL
Chol/HDL Ratio: 4.7 ratio (ref 0.0–5.0)
Cholesterol, Total: 182 mg/dL (ref 100–199)
HDL: 39 mg/dL — ABNORMAL LOW (ref >39)
LDL Chol Calc (NIH): 104 mg/dL — ABNORMAL HIGH (ref 0–99)
Triglycerides: 228 mg/dL — ABNORMAL HIGH (ref 0–149)
VLDL Cholesterol Cal: 39 mg/dL (ref 5–40)

## 2021-04-04 LAB — CMP14+EGFR
ALT: 42 IU/L (ref 0–44)
AST: 32 IU/L (ref 0–40)
Albumin/Globulin Ratio: 2 (ref 1.2–2.2)
Albumin: 5 g/dL — ABNORMAL HIGH (ref 3.8–4.9)
Alkaline Phosphatase: 70 IU/L (ref 44–121)
BUN/Creatinine Ratio: 14 (ref 9–20)
BUN: 14 mg/dL (ref 6–24)
Bilirubin Total: 0.8 mg/dL (ref 0.0–1.2)
CO2: 23 mmol/L (ref 20–29)
Calcium: 10 mg/dL (ref 8.7–10.2)
Chloride: 97 mmol/L (ref 96–106)
Creatinine, Ser: 0.97 mg/dL (ref 0.76–1.27)
Globulin, Total: 2.5 g/dL (ref 1.5–4.5)
Glucose: 205 mg/dL — ABNORMAL HIGH (ref 65–99)
Potassium: 4.5 mmol/L (ref 3.5–5.2)
Sodium: 138 mmol/L (ref 134–144)
Total Protein: 7.5 g/dL (ref 6.0–8.5)
eGFR: 93 mL/min/{1.73_m2} (ref >59)

## 2021-04-04 LAB — MICROALBUMIN, URINE: Microalbumin, Urine: 31.8 ug/mL

## 2021-04-04 LAB — HEMOGLOBIN A1C
Est. average glucose Bld gHb Est-mCnc: 209 mg/dL
Hgb A1c MFr Bld: 8.9 % — ABNORMAL HIGH (ref 4.8–5.6)

## 2021-04-09 ENCOUNTER — Other Ambulatory Visit: Payer: Self-pay | Admitting: Family Medicine

## 2021-04-09 MED ORDER — METFORMIN HCL 500 MG PO TABS: ORAL_TABLET | ORAL | 2 refills | Status: DC

## 2021-07-02 ENCOUNTER — Ambulatory Visit: Payer: PRIVATE HEALTH INSURANCE | Admitting: Family Medicine

## 2021-07-02 ENCOUNTER — Encounter: Payer: Self-pay | Admitting: Family Medicine

## 2021-07-02 ENCOUNTER — Other Ambulatory Visit: Payer: Self-pay

## 2021-07-02 ENCOUNTER — Ambulatory Visit (INDEPENDENT_AMBULATORY_CARE_PROVIDER_SITE_OTHER): Payer: No Typology Code available for payment source | Admitting: Family Medicine

## 2021-07-02 VITALS — BP 129/87 | HR 76 | Temp 97.0°F | Ht 70.0 in | Wt 222.6 lb

## 2021-07-02 DIAGNOSIS — E119 Type 2 diabetes mellitus without complications: Secondary | ICD-10-CM

## 2021-07-02 DIAGNOSIS — E785 Hyperlipidemia, unspecified: Secondary | ICD-10-CM | POA: Diagnosis not present

## 2021-07-02 DIAGNOSIS — R7301 Impaired fasting glucose: Secondary | ICD-10-CM

## 2021-07-02 DIAGNOSIS — Z0000 Encounter for general adult medical examination without abnormal findings: Secondary | ICD-10-CM

## 2021-07-02 MED ORDER — METFORMIN HCL 500 MG PO TABS: ORAL_TABLET | ORAL | 1 refills | Status: DC

## 2021-07-02 MED ORDER — ATORVASTATIN CALCIUM 20 MG PO TABS: 20.0000 mg | ORAL_TABLET | Freq: Every day | ORAL | 1 refills | Status: DC

## 2021-07-02 NOTE — Progress Notes (Signed)
Patient ID: Jeff Mccarthy, male    DOB: 1965-12-08, 55 y.o.   MRN: [id number]   Chief Complaint  Patient presents with   Diabetes    Follow up   Subjective:    HPI Pt seen for f/u diabetes.  Dec some weight almost 20 lbs.  Working on diet. Was 180 then to 150.  Now around 105-115 in am.  Taking metformin 51m bid with meals.  Had a few days diarrhea then it resolved.  Now no issues.  No in thirst or urination.  No burning or cuts on feet.  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  Compliant with meds. No chest pain, palpitations, myalgias or joint pains.  Medical History [place] medical [place]   Outpatient Encounter Medications as of 07/02/2021  Medication Sig   atorvastatin (LIPITOR) 20 MG tablet Take 1 tablet (20 mg total) by mouth daily.   fluticasone (FLONASE) 50 MCG/ACT nasal spray Place into both nostrils daily.   metFORMIN (GLUCOPHAGE) 500 MG tablet Take one tablet po BID with meals   pseudoephedrine (SUDAFED) 30 MG tablet Take 30 mg by mouth every 4 (four) hours as needed for congestion.   [DISCONTINUED] atorvastatin (LIPITOR) 20 MG tablet Take 1 tablet (20 mg total) by mouth daily.   [DISCONTINUED] metFORMIN (GLUCOPHAGE) 500 MG tablet Take one tablet po BID with meals   No facility-administered encounter medications on file as of 07/02/2021.     Review of Systems  Constitutional:  Negative for chills and fever.  HENT:  Negative for congestion, rhinorrhea and sore throat.   Respiratory:  Negative for cough, shortness of breath and wheezing.   Cardiovascular:  Negative for chest pain and leg swelling.  Gastrointestinal:  Negative for abdominal pain, diarrhea, nausea and vomiting.  Genitourinary:  Negative for dysuria and frequency.  Skin:  Negative for rash.  Neurological:  Negative for dizziness,  weakness and headaches.    Vitals BP 129/87   Pulse 76   Temp (!) 97 F (36.1 C) (Oral)   Ht 5' 10" (1.778 m)   Wt 222 lb 9.6 oz (101 kg)   BMI 31.94 kg/m   Objective:   Physical Exam Vitals and nursing note reviewed.  Constitutional:      General: He is not in acute distress.    Appearance: Normal appearance. He is not ill-appearing.  HENT:     Head: Normocephalic.     Nose: Nose normal. No congestion.     Mouth/Throat:     Mouth: Mucous membranes are moist.     Pharynx: No oropharyngeal exudate.  Eyes:     Extraocular Movements: Extraocular movements intact.     Conjunctiva/sclera: Conjunctivae normal.     Pupils: Pupils are equal, round, and reactive to light.  Cardiovascular:     Rate and Rhythm: Normal rate and regular rhythm.     Pulses: Normal pulses.     Heart sounds: Normal heart sounds. No murmur heard. Pulmonary:     Effort: Pulmonary effort is normal.     Breath sounds: Normal breath sounds. No wheezing, rhonchi or rales.  Musculoskeletal:        General: Normal range of motion.     Right lower leg: No edema.     Left lower leg: No edema.  Skin:    General: Skin is warm and dry.  Findings: No rash.  Neurological:     General: No focal deficit present.     Mental Status: He is alert and oriented to person, place, and time.     Cranial Nerves: No cranial nerve deficit.  Psychiatric:        Mood and Affect: Mood normal.        Behavior: Behavior normal.        Thought Content: Thought content normal.        Judgment: Judgment normal.     Assessment and Plan   1. Well adult exam  2. Hyperlipidemia, unspecified hyperlipidemia type - Hemoglobin A1c - CMP14+EGFR - atorvastatin (LIPITOR) 20 MG tablet; Take 1 tablet (20 mg total) by mouth daily.  Dispense: 90 tablet; Refill: 1  3. Type 2 diabetes mellitus without complication, without long-term current use of insulin (HCC) - Hemoglobin A1c - CMP14+EGFR - metFORMIN (GLUCOPHAGE) 500 MG tablet;  Take one tablet po BID with meals  Dispense: 180 tablet; Refill: 1   Dm2- stable. A1c improved at 6.4, compared ot last time at 8.9. Needing diabetic eye exam. -cont with metformin.  Hld- stable. Cont lipitor.  Return in about 6 months (around 01/02/2022) for f/u dm, .

## 2021-07-03 LAB — CMP14+EGFR
ALT: 24 IU/L (ref 0–44)
AST: 17 IU/L (ref 0–40)
Albumin/Globulin Ratio: 2.1 (ref 1.2–2.2)
Albumin: 5 g/dL — ABNORMAL HIGH (ref 3.8–4.9)
Alkaline Phosphatase: 60 IU/L (ref 44–121)
BUN/Creatinine Ratio: 15 (ref 9–20)
BUN: 14 mg/dL (ref 6–24)
Bilirubin Total: 0.5 mg/dL (ref 0.0–1.2)
CO2: 23 mmol/L (ref 20–29)
Calcium: 9.9 mg/dL (ref 8.7–10.2)
Chloride: 103 mmol/L (ref 96–106)
Creatinine, Ser: 0.93 mg/dL (ref 0.76–1.27)
Globulin, Total: 2.4 g/dL (ref 1.5–4.5)
Glucose: 105 mg/dL — ABNORMAL HIGH (ref 65–99)
Potassium: 4.4 mmol/L (ref 3.5–5.2)
Sodium: 142 mmol/L (ref 134–144)
Total Protein: 7.4 g/dL (ref 6.0–8.5)
eGFR: 98 mL/min/{1.73_m2} (ref >59)

## 2021-07-03 LAB — HEMOGLOBIN A1C
Est. average glucose Bld gHb Est-mCnc: 137 mg/dL
Hgb A1c MFr Bld: 6.4 % — ABNORMAL HIGH (ref 4.8–5.6)

## 2021-07-24 DIAGNOSIS — I255 Ischemic cardiomyopathy: Secondary | ICD-10-CM

## 2021-07-24 DIAGNOSIS — I420 Dilated cardiomyopathy: Secondary | ICD-10-CM

## 2021-07-24 HISTORY — DX: Dilated cardiomyopathy: I25.5

## 2021-07-24 HISTORY — DX: Dilated cardiomyopathy: I42.0

## 2021-08-09 ENCOUNTER — Inpatient Hospital Stay (HOSPITAL_COMMUNITY)
Admission: EM | Admit: 2021-08-09 | Discharge: 2021-08-11 | DRG: 246 | Disposition: A | Discharge status: Home / Self Care | Payer: PRIVATE HEALTH INSURANCE | Attending: Cardiology | Admitting: Cardiology

## 2021-08-09 ENCOUNTER — Encounter (HOSPITAL_COMMUNITY): Payer: Self-pay | Admitting: *Deleted

## 2021-08-09 ENCOUNTER — Other Ambulatory Visit: Payer: Self-pay

## 2021-08-09 ENCOUNTER — Emergency Department (HOSPITAL_COMMUNITY): Payer: PRIVATE HEALTH INSURANCE

## 2021-08-09 ENCOUNTER — Encounter (HOSPITAL_COMMUNITY)
Admission: EM | Disposition: A | Discharge status: Home / Self Care | Payer: Self-pay | Source: Home / Self Care | Attending: Cardiology

## 2021-08-09 DIAGNOSIS — Z881 Allergy status to other antibiotic agents status: Secondary | ICD-10-CM | POA: Diagnosis not present

## 2021-08-09 DIAGNOSIS — R030 Elevated blood-pressure reading, without diagnosis of hypertension: Secondary | ICD-10-CM | POA: Diagnosis present

## 2021-08-09 DIAGNOSIS — Z716 Tobacco abuse counseling: Secondary | ICD-10-CM

## 2021-08-09 DIAGNOSIS — I5021 Acute systolic (congestive) heart failure: Secondary | ICD-10-CM | POA: Diagnosis not present

## 2021-08-09 DIAGNOSIS — E785 Hyperlipidemia, unspecified: Secondary | ICD-10-CM

## 2021-08-09 DIAGNOSIS — Z79899 Other long term (current) drug therapy: Secondary | ICD-10-CM | POA: Diagnosis not present

## 2021-08-09 DIAGNOSIS — I213 ST elevation (STEMI) myocardial infarction of unspecified site: Secondary | ICD-10-CM

## 2021-08-09 DIAGNOSIS — E1169 Type 2 diabetes mellitus with other specified complication: Secondary | ICD-10-CM | POA: Diagnosis present

## 2021-08-09 DIAGNOSIS — I5041 Acute combined systolic (congestive) and diastolic (congestive) heart failure: Secondary | ICD-10-CM | POA: Diagnosis present

## 2021-08-09 DIAGNOSIS — I340 Nonrheumatic mitral (valve) insufficiency: Secondary | ICD-10-CM | POA: Diagnosis present

## 2021-08-09 DIAGNOSIS — Z20822 Contact with and (suspected) exposure to covid-19: Secondary | ICD-10-CM | POA: Diagnosis present

## 2021-08-09 DIAGNOSIS — I2102 ST elevation (STEMI) myocardial infarction involving left anterior descending coronary artery: Secondary | ICD-10-CM | POA: Diagnosis present

## 2021-08-09 DIAGNOSIS — Z6831 Body mass index (BMI) 31.0-31.9, adult: Secondary | ICD-10-CM | POA: Diagnosis not present

## 2021-08-09 DIAGNOSIS — E118 Type 2 diabetes mellitus with unspecified complications: Secondary | ICD-10-CM | POA: Diagnosis present

## 2021-08-09 DIAGNOSIS — F17210 Nicotine dependence, cigarettes, uncomplicated: Secondary | ICD-10-CM | POA: Diagnosis present

## 2021-08-09 DIAGNOSIS — I2510 Atherosclerotic heart disease of native coronary artery without angina pectoris: Secondary | ICD-10-CM

## 2021-08-09 DIAGNOSIS — Z7984 Long term (current) use of oral hypoglycemic drugs: Secondary | ICD-10-CM | POA: Diagnosis not present

## 2021-08-09 DIAGNOSIS — E669 Obesity, unspecified: Secondary | ICD-10-CM | POA: Diagnosis present

## 2021-08-09 DIAGNOSIS — E876 Hypokalemia: Secondary | ICD-10-CM | POA: Diagnosis present

## 2021-08-09 DIAGNOSIS — E119 Type 2 diabetes mellitus without complications: Secondary | ICD-10-CM

## 2021-08-09 DIAGNOSIS — Z955 Presence of coronary angioplasty implant and graft: Secondary | ICD-10-CM

## 2021-08-09 HISTORY — DX: Type 2 diabetes mellitus with other specified complication: E11.69

## 2021-08-09 HISTORY — PX: LEFT HEART CATH AND CORONARY ANGIOGRAPHY: CATH118249

## 2021-08-09 HISTORY — PX: CORONARY/GRAFT ACUTE MI REVASCULARIZATION: CATH118305

## 2021-08-09 HISTORY — DX: Atherosclerotic heart disease of native coronary artery without angina pectoris: I25.10

## 2021-08-09 HISTORY — DX: ST elevation (STEMI) myocardial infarction involving left anterior descending coronary artery: I21.02

## 2021-08-09 HISTORY — DX: Type 2 diabetes mellitus with other specified complication: E78.5

## 2021-08-09 HISTORY — PX: CORONARY STENT INTERVENTION: CATH118234

## 2021-08-09 LAB — RESP PANEL BY RT-PCR (FLU A&B, COVID) ARPGX2
Influenza A by PCR: NEGATIVE
Influenza B by PCR: NEGATIVE
SARS Coronavirus 2 by RT PCR: NEGATIVE

## 2021-08-09 LAB — CBG MONITORING, ED: Glucose-Capillary: 139 mg/dL — ABNORMAL HIGH (ref 70–99)

## 2021-08-09 LAB — CBC
HCT: 49 % (ref 39.0–52.0)
Hemoglobin: 17.3 g/dL — ABNORMAL HIGH (ref 13.0–17.0)
MCH: 32.3 pg (ref 26.0–34.0)
MCHC: 35.3 g/dL (ref 30.0–36.0)
MCV: 91.6 fL (ref 80.0–100.0)
Platelets: 216 10*3/uL (ref 150–400)
RBC: 5.35 MIL/uL (ref 4.22–5.81)
RDW: 12.5 % (ref 11.5–15.5)
WBC: 10.5 10*3/uL (ref 4.0–10.5)
nRBC: 0 % (ref 0.0–0.2)

## 2021-08-09 LAB — GLUCOSE, CAPILLARY: Glucose-Capillary: 138 mg/dL — ABNORMAL HIGH (ref 70–99)

## 2021-08-09 LAB — BASIC METABOLIC PANEL
Anion gap: 4 — ABNORMAL LOW (ref 5–15)
BUN: 14 mg/dL (ref 6–20)
CO2: 29 mmol/L (ref 22–32)
Calcium: 9 mg/dL (ref 8.9–10.3)
Chloride: 102 mmol/L (ref 98–111)
Creatinine, Ser: 0.87 mg/dL (ref 0.61–1.24)
GFR, Estimated: >60 mL/min (ref >60)
Glucose, Bld: 129 mg/dL — ABNORMAL HIGH (ref 70–99)
Potassium: 3.4 mmol/L — ABNORMAL LOW (ref 3.5–5.1)
Sodium: 135 mmol/L (ref 135–145)

## 2021-08-09 LAB — LACTIC ACID, PLASMA: Lactic Acid, Venous: 1.7 mmol/L (ref 0.5–1.9)

## 2021-08-09 LAB — TROPONIN I (HIGH SENSITIVITY): Troponin I (High Sensitivity): 8 ng/L (ref <18)

## 2021-08-09 SURGERY — CORONARY/GRAFT ACUTE MI REVASCULARIZATION
Anesthesia: LOCAL

## 2021-08-09 MED ORDER — MIDAZOLAM HCL 2 MG/2ML IJ SOLN
INTRAMUSCULAR | Status: DC | PRN
  Administered 2021-08-09: 2 mg via INTRAVENOUS

## 2021-08-09 MED ORDER — ASPIRIN 81 MG PO CHEW
81.0000 mg | CHEWABLE_TABLET | Freq: Every day | ORAL | Status: DC
  Administered 2021-08-10 – 2021-08-11 (×2): 81 mg via ORAL
  Filled 2021-08-09 (×2): qty 1

## 2021-08-09 MED ORDER — TICAGRELOR 90 MG PO TABS
ORAL_TABLET | ORAL | Status: AC
  Filled 2021-08-09: qty 2

## 2021-08-09 MED ORDER — HEPARIN (PORCINE) 25000 UT/250ML-% IV SOLN
INTRAVENOUS | Status: AC
  Administered 2021-08-09: 1150 [IU]/h via INTRAVENOUS
  Filled 2021-08-09: qty 250

## 2021-08-09 MED ORDER — NITROGLYCERIN 1 MG/10 ML FOR IR/CATH LAB
INTRA_ARTERIAL | Status: DC | PRN
  Administered 2021-08-09: 100 ug via INTRACORONARY

## 2021-08-09 MED ORDER — HEPARIN (PORCINE) IN NACL 1000-0.9 UT/500ML-% IV SOLN
INTRAVENOUS | Status: AC
  Filled 2021-08-09: qty 1000

## 2021-08-09 MED ORDER — SODIUM CHLORIDE 0.9% FLUSH
3.0000 mL | Freq: Two times a day (BID) | INTRAVENOUS | Status: DC
  Administered 2021-08-09 – 2021-08-11 (×4): 3 mL via INTRAVENOUS

## 2021-08-09 MED ORDER — TICAGRELOR 90 MG PO TABS
ORAL_TABLET | ORAL | Status: DC | PRN
  Administered 2021-08-09: 180 mg via ORAL

## 2021-08-09 MED ORDER — HEPARIN (PORCINE) 25000 UT/250ML-% IV SOLN: 15.0000 [IU]/kg/h | INTRAVENOUS | Status: DC

## 2021-08-09 MED ORDER — HEPARIN SODIUM (PORCINE) 1000 UNIT/ML IJ SOLN
INTRAMUSCULAR | Status: DC | PRN
  Administered 2021-08-09: 6000 [IU] via INTRAVENOUS
  Administered 2021-08-09: 2000 [IU] via INTRAVENOUS

## 2021-08-09 MED ORDER — NITROGLYCERIN 0.4 MG SL SUBL: 0.4000 mg | SUBLINGUAL_TABLET | SUBLINGUAL | Status: DC | PRN

## 2021-08-09 MED ORDER — HYDRALAZINE HCL 20 MG/ML IJ SOLN: 10.0000 mg | INTRAMUSCULAR | Status: AC | PRN

## 2021-08-09 MED ORDER — NITROGLYCERIN 0.4 MG SL SUBL
0.4000 mg | SUBLINGUAL_TABLET | Freq: Once | SUBLINGUAL | Status: AC
  Administered 2021-08-09: 0.4 mg via SUBLINGUAL
  Filled 2021-08-09: qty 1

## 2021-08-09 MED ORDER — INSULIN ASPART 100 UNIT/ML IJ SOLN
0.0000 [IU] | Freq: Three times a day (TID) | INTRAMUSCULAR | Status: DC
  Administered 2021-08-10 (×2): 3 [IU] via SUBCUTANEOUS
  Administered 2021-08-11: 2 [IU] via SUBCUTANEOUS

## 2021-08-09 MED ORDER — CARVEDILOL 3.125 MG PO TABS
3.1250 mg | ORAL_TABLET | Freq: Two times a day (BID) | ORAL | Status: DC
  Administered 2021-08-09 – 2021-08-11 (×4): 3.125 mg via ORAL
  Filled 2021-08-09 (×4): qty 1

## 2021-08-09 MED ORDER — HEPARIN SODIUM (PORCINE) 1000 UNIT/ML IJ SOLN
INTRAMUSCULAR | Status: AC
  Filled 2021-08-09: qty 1

## 2021-08-09 MED ORDER — VERAPAMIL HCL 2.5 MG/ML IV SOLN
INTRAVENOUS | Status: AC
  Filled 2021-08-09: qty 2

## 2021-08-09 MED ORDER — HEPARIN (PORCINE) 25000 UT/250ML-% IV SOLN: 1150.0000 [IU]/h | INTRAVENOUS | Status: DC

## 2021-08-09 MED ORDER — ONDANSETRON HCL 4 MG/2ML IJ SOLN: 4.0000 mg | Freq: Four times a day (QID) | INTRAMUSCULAR | Status: DC | PRN

## 2021-08-09 MED ORDER — SODIUM CHLORIDE 0.9 % IV SOLN
INTRAVENOUS | Status: AC | PRN
  Administered 2021-08-09: 10 mL/h via INTRAVENOUS

## 2021-08-09 MED ORDER — VERAPAMIL HCL 2.5 MG/ML IV SOLN
INTRAVENOUS | Status: DC | PRN
  Administered 2021-08-09: 10 mL via INTRA_ARTERIAL

## 2021-08-09 MED ORDER — SODIUM CHLORIDE 0.9% FLUSH: 3.0000 mL | INTRAVENOUS | Status: DC | PRN

## 2021-08-09 MED ORDER — DAPAGLIFLOZIN PROPANEDIOL 10 MG PO TABS
10.0000 mg | ORAL_TABLET | Freq: Every day | ORAL | Status: DC
  Administered 2021-08-10 – 2021-08-11 (×2): 10 mg via ORAL
  Filled 2021-08-09 (×2): qty 1

## 2021-08-09 MED ORDER — MIDAZOLAM HCL 2 MG/2ML IJ SOLN
INTRAMUSCULAR | Status: AC
  Filled 2021-08-09: qty 2

## 2021-08-09 MED ORDER — NITROGLYCERIN 0.4 MG SL SUBL
SUBLINGUAL_TABLET | SUBLINGUAL | Status: AC
  Administered 2021-08-09: 0.4 mg via SUBLINGUAL
  Filled 2021-08-09: qty 1

## 2021-08-09 MED ORDER — HEPARIN BOLUS VIA INFUSION
4000.0000 [IU] | Freq: Once | INTRAVENOUS | Status: AC
  Administered 2021-08-09: 4000 [IU] via INTRAVENOUS

## 2021-08-09 MED ORDER — ATORVASTATIN CALCIUM 80 MG PO TABS
80.0000 mg | ORAL_TABLET | Freq: Every day | ORAL | Status: DC
  Administered 2021-08-10 – 2021-08-11 (×2): 80 mg via ORAL
  Filled 2021-08-09 (×2): qty 1

## 2021-08-09 MED ORDER — TICAGRELOR 90 MG PO TABS
90.0000 mg | ORAL_TABLET | Freq: Two times a day (BID) | ORAL | Status: DC
  Administered 2021-08-10 – 2021-08-11 (×3): 90 mg via ORAL
  Filled 2021-08-09 (×3): qty 1

## 2021-08-09 MED ORDER — IOHEXOL 350 MG/ML SOLN
INTRAVENOUS | Status: DC | PRN
  Administered 2021-08-09: 205 mL via INTRA_ARTERIAL

## 2021-08-09 MED ORDER — HEPARIN SODIUM (PORCINE) 5000 UNIT/ML IJ SOLN
4000.0000 [IU] | Freq: Once | INTRAMUSCULAR | Status: DC
  Filled 2021-08-09: qty 1

## 2021-08-09 MED ORDER — LABETALOL HCL 5 MG/ML IV SOLN: 10.0000 mg | INTRAVENOUS | Status: AC | PRN

## 2021-08-09 MED ORDER — LIDOCAINE HCL (PF) 1 % IJ SOLN
INTRAMUSCULAR | Status: DC | PRN
  Administered 2021-08-09: 2 mL via SUBCUTANEOUS

## 2021-08-09 MED ORDER — ACETAMINOPHEN 325 MG PO TABS: 650.0000 mg | ORAL_TABLET | ORAL | Status: DC | PRN

## 2021-08-09 MED ORDER — LIDOCAINE HCL (PF) 1 % IJ SOLN
INTRAMUSCULAR | Status: AC
  Filled 2021-08-09: qty 30

## 2021-08-09 MED ORDER — SODIUM CHLORIDE 0.9 % IV SOLN: INTRAVENOUS | Status: DC

## 2021-08-09 MED ORDER — TICAGRELOR 90 MG PO TABS: 90.0000 mg | ORAL_TABLET | Freq: Two times a day (BID) | ORAL | Status: DC

## 2021-08-09 MED ORDER — SODIUM CHLORIDE 0.9 % IV SOLN: 250.0000 mL | INTRAVENOUS | Status: DC | PRN

## 2021-08-09 MED ORDER — FENTANYL CITRATE (PF) 100 MCG/2ML IJ SOLN
INTRAMUSCULAR | Status: DC | PRN
  Administered 2021-08-09: 50 ug via INTRAVENOUS
  Administered 2021-08-09 (×2): 25 ug via INTRAVENOUS

## 2021-08-09 MED ORDER — ASPIRIN 81 MG PO CHEW: 81.0000 mg | CHEWABLE_TABLET | Freq: Every day | ORAL | Status: DC

## 2021-08-09 MED ORDER — ACETAMINOPHEN 500 MG PO TABS
1000.0000 mg | ORAL_TABLET | Freq: Once | ORAL | Status: AC
  Administered 2021-08-09: 1000 mg via ORAL
  Filled 2021-08-09: qty 2

## 2021-08-09 MED ORDER — MORPHINE SULFATE (PF) 2 MG/ML IV SOLN: 2.0000 mg | INTRAVENOUS | Status: DC | PRN

## 2021-08-09 MED ORDER — HEPARIN (PORCINE) IN NACL 1000-0.9 UT/500ML-% IV SOLN
INTRAVENOUS | Status: DC | PRN
  Administered 2021-08-09 (×2): 500 mL

## 2021-08-09 MED ORDER — SODIUM CHLORIDE 0.9 % IV SOLN: INTRAVENOUS | Status: AC

## 2021-08-09 MED ORDER — FENTANYL CITRATE (PF) 100 MCG/2ML IJ SOLN
INTRAMUSCULAR | Status: AC
  Filled 2021-08-09: qty 2

## 2021-08-09 MED ORDER — ASPIRIN 325 MG PO TABS
325.0000 mg | ORAL_TABLET | Freq: Once | ORAL | Status: AC
  Administered 2021-08-09: 325 mg via ORAL
  Filled 2021-08-09: qty 1

## 2021-08-09 SURGICAL SUPPLY — 22 items
BALLN SAPPHIRE 2.5X15 (BALLOONS) ×2
BALLN SAPPHIRE ~~LOC~~ 4.0X8 (BALLOONS) ×1 IMPLANT
BALLOON SAPPHIRE 2.5X15 (BALLOONS) IMPLANT
CATH INFINITI JR4 5F (CATHETERS) ×1 IMPLANT
CATH OPTITORQUE TIG 4.0 5F (CATHETERS) ×1 IMPLANT
CATH VISTA GUIDE 6FR XBLAD3.5 (CATHETERS) ×1 IMPLANT
DEVICE RAD COMP TR BAND LRG (VASCULAR PRODUCTS) ×1 IMPLANT
ELECT DEFIB PAD ADLT CADENCE (PAD) ×1 IMPLANT
GLIDESHEATH SLEND SS 6F .021 (SHEATH) ×1 IMPLANT
GUIDEWIRE INQWIRE 1.5J.035X260 (WIRE) IMPLANT
INQWIRE 1.5J .035X260CM (WIRE) ×2
KIT ENCORE 26 ADVANTAGE (KITS) ×1 IMPLANT
KIT HEART LEFT (KITS) ×2 IMPLANT
PACK CARDIAC CATHETERIZATION (CUSTOM PROCEDURE TRAY) ×2 IMPLANT
SHEATH PROBE COVER 6X72 (BAG) ×1 IMPLANT
STENT ONYX FRONTIER 3.0X18 (Permanent Stent) ×1 IMPLANT
STENT ONYX FRONTIER 3.0X26 (Permanent Stent) ×1 IMPLANT
SYR MEDRAD MARK 7 150ML (SYRINGE) ×2 IMPLANT
TRANSDUCER W/STOPCOCK (MISCELLANEOUS) ×2 IMPLANT
TUBING CIL FLEX 10 FLL-RA (TUBING) ×2 IMPLANT
WIRE ASAHI PROWATER 180CM (WIRE) ×1 IMPLANT
WIRE COUGAR XT STRL 190CM (WIRE) ×1 IMPLANT

## 2021-08-09 NOTE — ED Triage Notes (Signed)
States he has chest pain onset 2 hours ago, states he has never had any pain like this, states he became diaphoretic

## 2021-08-09 NOTE — H&P (Addendum)
Cardiology Admission History and Physical:   Patient ID: Jeff Mccarthy MRN: [id number]; DOB: 01/10/1966   Admission date: 08/09/2021  PCP:  Erven Colla, DO   [facility] Providers Cardiologist:  None        Chief Complaint:  Chest pain  Patient Profile:   Jeff Mccarthy is a 55 y.o. male with DM and tobacco use who is being seen 08/09/2021 for the evaluation of Chest pain.   History of Present Illness:   Jeff Mccarthy presents emergency department for evaluation of chest pain.  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  States he has never had pain like this before.  Pain is associated with shortness of breath.  Denies abdominal pain, diarrhea, cough, fever or other systemic symptoms.  Pain is nonradiating and acute, 10/10. His EKG showed ST depression in the inferior leads and subtle STE in V1/V2. StEMI was activated and brought [facility]. At arrival, he has 3/10 chest pain and feels better. No previous h/o CAD or heart cath.  Past Medical History:  Diagnosis Date   Diabetes mellitus without complication [facility])     Past Surgical History:  Procedure Laterality Date   COLONOSCOPY     COLONOSCOPY N/A 07/08/2018   Procedure: COLONOSCOPY;  Surgeon: Daneil Dolin, MD;  [facility];  Service: Endoscopy;  Laterality: N/A;  12:15   COLONOSCOPY N/A 07/13/2018   Procedure: COLONOSCOPY;  Surgeon: Daneil Dolin, MD;  [facility];  Service: Endoscopy;  Laterality: N/A;   NECK SURGERY     C5, C6   POLYPECTOMY     POLYPECTOMY  07/08/2018   Procedure: POLYPECTOMY;  Surgeon: Daneil Dolin, MD;  [facility];  Service: Endoscopy;;  cecal, hepatic flexure,     Medications Prior to Admission: Prior to Admission medications   Medication Sig Start Date End Date Taking? Authorizing Provider  atorvastatin (LIPITOR) 20  MG tablet Take 1 tablet (20 mg total) by mouth daily. Patient taking differently: Take 20 mg by mouth every evening. 07/02/21  Yes Lovena Le, Malena M, DO  metFORMIN (GLUCOPHAGE) 500 MG tablet Take one tablet po BID with meals Patient taking differently: Take 500 mg by mouth 2 (two) times daily with a meal. 07/02/21  Yes Lovena Le, Malena M, DO     Allergies:    Allergies  Allergen Reactions   Biaxin [Clarithromycin] Other (See Comments)    GI upset    Social History:   Social History   Socioeconomic History   Marital status: Married    Spouse name: Not on file   Number of children: Not on file   Years of education: Not on file   Highest education level: Not on file  Occupational History   Not on file  Tobacco Use   Smoking status: Former    Types: Cigarettes    Quit date: 11/22/2014    Years since quitting: 6.7   Smokeless tobacco: Never  Vaping Use   Vaping Use: Never used  Substance and Sexual Activity   Alcohol use: Yes   Drug use: Never   Sexual activity: Not on file  Other Topics Concern   Not on file  Social History Narrative   Not on file   Social Determinants of Health   Financial Resource Strain: Not on file  Food Insecurity: Not on file  Transportation Needs: Not on file  Physical Activity: Not on file  Stress: Not on file  Social Connections: Not on file  Intimate Partner Violence: Not on file    Family History:  Non contyributory.  ROS:  Please see the history of present illness.  All other ROS reviewed and negative.     Physical Exam/Data:   Vitals:   08/09/21 1800 08/09/21 1815 08/09/21 1823 08/09/21 1936  BP: (!) 141/104 (!) 151/102 (!) 141/92   Pulse: 61 72 66   Resp: '16 14 17   '$ Temp:      TempSrc:      SpO2: 99% 100% 95% 100%  Weight:      Height:       No intake or output data in the 24 hours ending 08/09/21 2001 Last 3 Weights 08/09/2021 07/02/2021 04/03/2021  Weight (lbs) 218 lb 222 lb 9.6 oz 238 lb  Weight (kg) 98.884 kg 100.971 kg  107.956 kg     Body mass index is 31.28 kg/m.  General:  Well nourished, well developed, in no acute distress at admission here. HEENT: normal Neck: no JVD Vascular: No carotid bruits; Distal pulses 2+ bilaterally   Cardiac:  normal S1, S2; RRR; no murmur  Lungs:  clear to auscultation bilaterally, no wheezing, rhonchi or rales  Abd: soft, nontender, no hepatomegaly  Ext: no edema Musculoskeletal:  No deformities, BUE and BLE strength normal and equal Skin: warm and dry  Neuro:  CNs 2-12 intact, no focal abnormalities noted Psych:  Normal affect    EKG:  The ECG that was done it  was personally reviewed and demonstrates Sinus with ST depression in the inferior leads and subtle STE in V1/2  Relevant CV Studies: None  Laboratory Data:  High Sensitivity Troponin:   Recent Labs  Lab 08/09/21 1749  TROPONINIHS 8      Chemistry Recent Labs  Lab 08/09/21 1749  NA 135  K 3.4*  CL 102  CO2 29  GLUCOSE 129*  BUN 14  CREATININE 0.87  CALCIUM 9.0  GFRNONAA >60  ANIONGAP 4*    No results for input(s): PROT, ALBUMIN, AST, ALT, ALKPHOS, BILITOT in the last 168 hours. Lipids No results for input(s): CHOL, TRIG, HDL, LABVLDL, LDLCALC, CHOLHDL in the last 168 hours. Hematology Recent Labs  Lab 08/09/21 1749  WBC 10.5  RBC 5.35  HGB 17.3*  HCT 49.0  MCV 91.6  MCH 32.3  MCHC 35.3  RDW 12.5  PLT 216   Thyroid No results for input(s): TSH, FREET4 in the last 168 hours. BNPNo results for input(s): BNP, PROBNP in the last 168 hours.  DDimer No results for input(s): DDIMER in the last 168 hours.   Radiology/Studies:  DG Chest 2 View  Result Date: 08/09/2021 CLINICAL DATA:  Acute chest pain today. EXAM: CHEST - 2 VIEW COMPARISON:  10/22/2004 chest radiograph FINDINGS: The cardiomediastinal silhouette is unchanged and unremarkable. Mild peribronchial thickening again noted. There is no evidence of focal airspace disease, pulmonary edema, suspicious pulmonary nodule/mass,  pleural effusion, or pneumothorax. No acute bony abnormalities are identified. LOWER cervical fusion changes again noted. IMPRESSION: No active cardiopulmonary disease. Electronically Signed   By: Margarette Canada M.D.   On: 08/09/2021 18:34     Assessment and Plan:   STEMI: Will take the patient emergently to the cath lab. Further plan of care based on the findings. Will get an echo to evaluate LV function. DM: Will hold the metformin. SSI as needed. Advised to quit  smoking.   Risk Assessment/Risk Scores:  :1}  TIMI Risk Score for ST  Elevation MI:   The patient's TIMI risk score is 3, which indicates a 4.44% risk of all cause mortality at 30 days.{  Severity of Illness: The appropriate patient status for this patient is INPATIENT. Inpatient status is judged to be reasonable and necessary in order to provide the required intensity of service to ensure the patient's safety. The patient's presenting symptoms, physical exam findings, and initial radiographic and laboratory data in the context of their chronic comorbidities is felt to place them at high risk for further clinical deterioration. Furthermore, it is not anticipated that the patient will be medically stable for discharge from the hospital within 2 midnights of admission. The following factors support the patient status of inpatient.   " The patient's presenting symptoms include chest pain. " The worrisome physical exam findings include STEMI. " The chronic co-morbidities include DM and smoking.   * I certify that at the point of admission it is my clinical judgment that the patient will require inpatient hospital care spanning beyond 2 midnights from the point of admission due to high intensity of service, high risk for further deterioration and high frequency of surveillance required.*   For questions or updates, please contact [place] Please consult [email or web address] for contact info under     Signed, Robinette Haines, MD   08/09/2021 8:01 PM    ATTENDING ATTESTATION  I have seen, examined and evaluated the patient this evening in the emergency room along with Dr. Rodman Key.  After reviewing all the available data and chart, we discussed the patients laboratory, study & physical findings as well as symptoms in detail. I agree with his findings, examination as well as impression recommendations as per our discussion.    Patient is a 55 year old smoker with diabetes and hyperlipidemia (on low-dose atorvastatin) who is relatively active as a Airline pilot.  He started having a little bit of substernal tightness earlier in the afternoon of 08/09/2021, but noted having significant chest pain that caused him to go to the emergency room with 10/10 nonradiating chest heaviness and pressure with dyspnea and diaphoresis while walking into his house from his car.  Initially presented to [facility] where he had subtle ST elevations in V1 as well as aVR minimal and in V1.  He continued to have symptoms, and sequential EKGs did show more prominent ST elevations in V1 and V2.  With his ongoing symptoms and EKG changes between Dr. Rodman Key, myself and Dr. Debe Coder from [facility] ER, we decided to call code STEMI with concern for anterior MI.  Upon arrival he was still having significant 07/01/2009 chest pain.  EKG still have a subtle changes but enough of a concern with ongoing symptoms to go to the Cath Lab.  Important to note, the delay in diagnosis was due to subtle criteria-did not technically meet criteria for STEMI with 2 consecutive leads as aVR and V1 are not considered consecutive leads, however there was significant ST depressions in the inferior leads concerning for ischemia..  The decision to colchicine was based on clinical presentation with concerning EKG.    Glenetta Hew, M.D., M.S. Interventional Cardiologist   Pager # [phone]

## 2021-08-09 NOTE — Progress Notes (Signed)
ANTICOAGULATION CONSULT NOTE - Initial Consult  Pharmacy Consult for Heparin Indication: chest pain/ACS  Allergies  Allergen Reactions   Biaxin [Clarithromycin] Other (See Comments)    GI upset    Patient Measurements: Height: '5\' 10"'$  (177.8 cm) Weight: 98.9 kg (218 lb) IBW/kg (Calculated) : 73 HEPARIN DW (KG): 93.5   Vital Signs: Temp: 98.6 F (37 C) (09/17 1757) Temp Source: Oral (09/17 1757) BP: 141/92 (09/17 1823) Pulse Rate: 66 (09/17 1823)  Labs: Recent Labs    08/09/21 1749  HGB 17.3*  HCT 49.0  PLT 216    CrCl cannot be calculated (Patient's most recent lab result is older than the maximum 21 days allowed.).   Medical History: Past Medical History:  Diagnosis Date   Diabetes mellitus without complication ([place])     Medications:  See med rec  Assessment: 55 yo presents to ED with chest pain and is diaphoretic. Reviewed home meds and not on any oral anticoagulants. Pharmacy asked to start heparin.  Goal of Therapy:  Heparin level 0.3-0.7 units/ml Monitor platelets by anticoagulation protocol: Yes   Plan:  Give 4000 units bolus x 1 Start heparin infusion at 1150 units/hr Check anti-Xa level in ~6  hours and daily while on heparin Continue to monitor H&H and platelets  Isac Sarna, BS Vena Austria, BCPS Clinical Pharmacist Pager [phone]  08/09/2021,6:33 PM

## 2021-08-09 NOTE — ED Provider Notes (Addendum)
[facility] EMERGENCY DEPARTMENT Provider Note   CSN: [id number] Arrival date & time: 08/09/21  1732     History Chief Complaint  Patient presents with   Chest Pain    Jeff Mccarthy is a 55 y.o. male with PMH diabetes, tobacco use who presents emergency department for evaluation of chest pain.  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  States he has never had pain like this before.  Pain is associated with shortness of breath.  Denies abdominal pain, diarrhea, cough, fever or other systemic symptoms.  Pain is nonradiating and acute, 10/10.   Chest Pain Associated symptoms: diaphoresis, nausea and shortness of breath   Associated symptoms: no abdominal pain, no back pain, no cough, no fever, no palpitations and no vomiting       Past Medical History:  Diagnosis Date   Diabetes mellitus without complication [facility])     Patient Active Problem List   Diagnosis Date Noted   Hematochezia    Dizziness    Diaphoresis    Hx of colonoscopy    Diabetes ([place]) 04/20/2015   Elevated liver enzymes 04/20/2015   Impaired fasting glucose 12/02/2014   Hyperlipidemia 12/02/2014   Obesity 12/02/2014   LATERAL EPICONDYLITIS 02/10/2010    Past Surgical History:  Procedure Laterality Date   COLONOSCOPY     COLONOSCOPY N/A 07/08/2018   Procedure: COLONOSCOPY;  Surgeon: Daneil Dolin, MD;  [facility];  Service: Endoscopy;  Laterality: N/A;  12:15   COLONOSCOPY N/A 07/13/2018   Procedure: COLONOSCOPY;  Surgeon: Daneil Dolin, MD;  [facility];  Service: Endoscopy;  Laterality: N/A;   NECK SURGERY     C5, C6   POLYPECTOMY     POLYPECTOMY  07/08/2018   Procedure: POLYPECTOMY;  Surgeon: Daneil Dolin, MD;  [facility];  Service: Endoscopy;;  cecal, hepatic flexure,       History reviewed. No pertinent family  history.  Social History   Tobacco Use   Smoking status: Former    Types: Cigarettes    Quit date: 11/22/2014    Years since quitting: 6.7   Smokeless tobacco: Never  Vaping Use   Vaping Use: Never used  Substance Use Topics   Alcohol use: Yes   Drug use: Never    Home Medications Prior to Admission medications   Medication Sig Start Date End Date Taking? Authorizing Provider  atorvastatin (LIPITOR) 20 MG tablet Take 1 tablet (20 mg total) by mouth daily. Patient taking differently: Take 20 mg by mouth every evening. 07/02/21  Yes Lovena Le, Malena M, DO  metFORMIN (GLUCOPHAGE) 500 MG tablet Take one tablet po BID with meals Patient taking differently: Take 500 mg by mouth 2 (two) times daily with a meal. 07/02/21  Yes Lovena Le, Malena M, DO    Allergies    Biaxin [clarithromycin]  Review of Systems   Review of Systems  Constitutional:  Positive for diaphoresis. Negative for chills and fever.  HENT:  Negative for ear pain and sore throat.   Eyes:  Negative for pain and visual disturbance.  Respiratory:  Positive for shortness of breath. Negative for cough.   Cardiovascular:  Positive for chest pain. Negative for palpitations.  Gastrointestinal:  Positive for nausea. Negative for abdominal pain and vomiting.  Genitourinary:  Negative for dysuria and hematuria.  Musculoskeletal:  Negative for arthralgias and  back pain.  Skin:  Negative for color change and rash.  Neurological:  Negative for seizures and syncope.  All other systems reviewed and are negative.  Physical Exam Updated Vital Signs BP (!) 141/92 (BP Location: Left Arm)   Pulse 66   Temp 98.6 F (37 C) (Oral)   Resp 17   Ht '5\' 10"'$  (1.778 m)   Wt 98.9 kg   SpO2 95%   BMI 31.28 kg/m   Physical Exam Vitals and nursing note reviewed.  Constitutional:      Appearance: He is well-developed. He is ill-appearing and diaphoretic.  HENT:     Head: Normocephalic and atraumatic.  Eyes:     Conjunctiva/sclera:  Conjunctivae normal.  Cardiovascular:     Rate and Rhythm: Normal rate and regular rhythm.     Heart sounds: No murmur heard. Pulmonary:     Effort: Pulmonary effort is normal. No respiratory distress.     Breath sounds: Normal breath sounds.  Abdominal:     Palpations: Abdomen is soft.     Tenderness: There is no abdominal tenderness.  Musculoskeletal:     Cervical back: Neck supple.  Skin:    General: Skin is warm.  Neurological:     Mental Status: He is alert.    ED Results / Procedures / Treatments   Labs (all labs ordered are listed, but only abnormal results are displayed) Labs Reviewed  BASIC METABOLIC PANEL - Abnormal; Notable for the following components:      Result Value   Potassium 3.4 (*)    Glucose, Bld 129 (*)    Anion gap 4 (*)    All other components within normal limits  CBC - Abnormal; Notable for the following components:   Hemoglobin 17.3 (*)    All other components within normal limits  CBG MONITORING, ED - Abnormal; Notable for the following components:   Glucose-Capillary 139 (*)    All other components within normal limits  RESP PANEL BY RT-PCR (FLU A&B, COVID) ARPGX2  LACTIC ACID, PLASMA  LACTIC ACID, PLASMA  HEPARIN LEVEL (UNFRACTIONATED)  CBC  TROPONIN I (HIGH SENSITIVITY)    EKG EKG Interpretation  Date/Time:  Saturday August 09 2021 18:16:24 EDT Ventricular Rate:  63 PR Interval:  164 QRS Duration: 91 QT Interval:  409 QTC Calculation: 419 R Axis:   -5 Text Interpretation: Sinus rhythm Developing ST Elevation in anterior leads St depression in inferior leads ** ** ACUTE MI / STEMI ** ** Confirmed by Brylinn Teaney ([id number]) on 08/09/2021 6:50:29 PM  Radiology DG Chest 2 View  Result Date: 08/09/2021 CLINICAL DATA:  Acute chest pain today. EXAM: CHEST - 2 VIEW COMPARISON:  10/22/2004 chest radiograph FINDINGS: The cardiomediastinal silhouette is unchanged and unremarkable. Mild peribronchial thickening again noted. There is no  evidence of focal airspace disease, pulmonary edema, suspicious pulmonary nodule/mass, pleural effusion, or pneumothorax. No acute bony abnormalities are identified. LOWER cervical fusion changes again noted. IMPRESSION: No active cardiopulmonary disease. Electronically Signed   By: Margarette Canada M.D.   On: 08/09/2021 18:34    Procedures .Critical Care Performed by: Teressa Lower, MD Authorized by: Teressa Lower, MD   Critical care provider statement:    Critical care time (minutes):  30   Critical care was necessary to treat or prevent imminent or life-threatening deterioration of the following conditions:  Cardiac failure   Critical care was time spent personally by me on the following activities:  Ordering and performing treatments and interventions, ordering and review of  laboratory studies, ordering and review of radiographic studies, discussions with consultants and pulse oximetry   I assumed direction of critical care for this patient from another provider in my specialty: no     Care discussed with: accepting provider at another facility   Comments:     STEMI, heparin started   Medications Ordered in ED Medications  nitroGLYCERIN (NITROSTAT) SL tablet 0.4 mg (0.4 mg Sublingual Given 08/09/21 1832)  0.9 %  sodium chloride infusion (has no administration in time range)  heparin ADULT infusion 100 units/mL (25000 units/240m) (1,150 Units/hr Intravenous New Bag/Given 08/09/21 1842)  aspirin tablet 325 mg (325 mg Oral Given 08/09/21 1817)  nitroGLYCERIN (NITROSTAT) SL tablet 0.4 mg (0.4 mg Sublingual Given 08/09/21 1818)  acetaminophen (TYLENOL) tablet 1,000 mg (1,000 mg Oral Given 08/09/21 1814)  heparin bolus via infusion 4,000 Units (4,000 Units Intravenous Bolus from Bag 08/09/21 1842)    ED Course  I have reviewed the triage vital signs and the nursing notes.  Pertinent labs & imaging results that were available during my care of the patient were reviewed by me and considered in my  medical decision making (see chart for details).    MDM Rules/Calculators/A&P                           Patient seen the emergency department for evaluation of chest pain and diaphoresis.  Physical exam reveals an ill-appearing diaphoretic patient who is hemodynamically stable.  Cardiopulmonary exam unremarkable.  Laboratory evaluation unremarkable outside of mild hypokalemia, glucose 139 and initial troponin is 8.  Initial ECG with inverted T waves in the inferior leads with ST depression but no clear ST elevation.  Aspirin 325 given.  Patient went for chest x-ray and upon return from chest x-ray repeat ECG was performed that shows developing ST elevations in the anterior leads.  I spoke with cardiology who [facility]  Patient given nitroglycerin and started on a heparin drip.  Chest x-ray unremarkable.  Patient then transferred for STEMI.  Final Clinical Impression(s) / ED Diagnoses Final diagnoses:  None    Rx / DC Orders ED Discharge Orders     None        Trevar Boehringer, MD 08/09/21 1Yevette Edwards   KTeressa Lower MD 08/09/21 [phone]

## 2021-08-09 NOTE — Plan of Care (Signed)

## 2021-08-10 DIAGNOSIS — I2102 ST elevation (STEMI) myocardial infarction involving left anterior descending coronary artery: Principal | ICD-10-CM

## 2021-08-10 LAB — BASIC METABOLIC PANEL
Anion gap: 9 (ref 5–15)
BUN: 10 mg/dL (ref 6–20)
CO2: 23 mmol/L (ref 22–32)
Calcium: 9 mg/dL (ref 8.9–10.3)
Chloride: 104 mmol/L (ref 98–111)
Creatinine, Ser: 0.78 mg/dL (ref 0.61–1.24)
GFR, Estimated: >60 mL/min (ref >60)
Glucose, Bld: 127 mg/dL — ABNORMAL HIGH (ref 70–99)
Potassium: 3.8 mmol/L (ref 3.5–5.1)
Sodium: 136 mmol/L (ref 135–145)

## 2021-08-10 LAB — CBC
HCT: 45.9 % (ref 39.0–52.0)
Hemoglobin: 16 g/dL (ref 13.0–17.0)
MCH: 31.4 pg (ref 26.0–34.0)
MCHC: 34.9 g/dL (ref 30.0–36.0)
MCV: 90 fL (ref 80.0–100.0)
Platelets: 183 10*3/uL (ref 150–400)
RBC: 5.1 MIL/uL (ref 4.22–5.81)
RDW: 12.3 % (ref 11.5–15.5)
WBC: 10.7 10*3/uL — ABNORMAL HIGH (ref 4.0–10.5)
nRBC: 0 % (ref 0.0–0.2)

## 2021-08-10 LAB — TROPONIN I (HIGH SENSITIVITY)
Troponin I (High Sensitivity): >24000 ng/L (ref <18)
Troponin I (High Sensitivity): >24000 ng/L (ref <18)
Troponin I (High Sensitivity): >24000 ng/L (ref <18)

## 2021-08-10 LAB — LIPID PANEL
Cholesterol: 128 mg/dL (ref 0–200)
HDL: 34 mg/dL — ABNORMAL LOW (ref >40)
LDL Cholesterol: 61 mg/dL (ref 0–99)
Total CHOL/HDL Ratio: 3.8 RATIO
Triglycerides: 164 mg/dL — ABNORMAL HIGH (ref <150)
VLDL: 33 mg/dL (ref 0–40)

## 2021-08-10 LAB — GLUCOSE, CAPILLARY
Glucose-Capillary: 188 mg/dL — ABNORMAL HIGH (ref 70–99)
Glucose-Capillary: 161 mg/dL — ABNORMAL HIGH (ref 70–99)
Glucose-Capillary: 107 mg/dL — ABNORMAL HIGH (ref 70–99)
Glucose-Capillary: 139 mg/dL — ABNORMAL HIGH (ref 70–99)

## 2021-08-10 LAB — HEMOGLOBIN A1C
Hgb A1c MFr Bld: 6.4 % — ABNORMAL HIGH (ref 4.8–5.6)
Mean Plasma Glucose: 136.98 mg/dL

## 2021-08-10 LAB — POCT ACTIVATED CLOTTING TIME
Activated Clotting Time: 289 seconds
Activated Clotting Time: 289 seconds

## 2021-08-10 LAB — HEPARIN LEVEL (UNFRACTIONATED): Heparin Unfractionated: <0.1 IU/mL — ABNORMAL LOW (ref 0.30–0.70)

## 2021-08-10 MED ORDER — CHLORHEXIDINE GLUCONATE CLOTH 2 % EX PADS: 6.0000 | MEDICATED_PAD | Freq: Every day | CUTANEOUS | Status: DC

## 2021-08-10 NOTE — Progress Notes (Signed)
Patient transferred from [facility] at 1815hrs  Oriented to plan of care for shift.  Right radial site level zero with gauze dressing intact.

## 2021-08-10 NOTE — Progress Notes (Signed)
Progress Note  Patient Name: Jeff Mccarthy Date of Encounter: 08/10/2021  Primary Cardiologist: Glenetta Hew, MD  Subjective   No chest pain or shortness of breath, states he feels much better.  Inpatient Medications    Scheduled Meds:  aspirin  81 mg Oral Daily   atorvastatin  80 mg Oral Daily   carvedilol  3.125 mg Oral BID WC   Chlorhexidine Gluconate Cloth  6 each Topical Daily   dapagliflozin propanediol  10 mg Oral Daily   insulin aspart  0-15 Units Subcutaneous TID WC   sodium chloride flush  3 mL Intravenous Q12H   ticagrelor  90 mg Oral BID   Continuous Infusions:  sodium chloride     sodium chloride     PRN Meds: sodium chloride, acetaminophen, morphine injection, nitroGLYCERIN, ondansetron (ZOFRAN) IV, sodium chloride flush   Vital Signs    Vitals:   08/10/21 0500 08/10/21 0600 08/10/21 0700 08/10/21 0800  BP: 121/87 124/85 128/85 122/90  Pulse:      Resp: '15 12 14 15  '$ Temp:      TempSrc:      SpO2: 98% 98% 98% 97%  Weight:      Height:        Intake/Output Summary (Last 24 hours) at 08/10/2021 0828 Last data filed at 08/10/2021 0749 Gross per 24 hour  Intake 762.14 ml  Output 2275 ml  Net -1512.86 ml   Filed Weights   08/09/21 1758 08/09/21 2055  Weight: 98.9 kg 99.1 kg    Telemetry    Sinus rhythm with occasional PVCs and bursts of NSVT.  Personally reviewed.  ECG    An ECG dated 08/10/2021 was personally reviewed today and demonstrated:  Sinus rhythm with low voltage and decreased R wave progression.  Physical Exam   GEN: No acute distress.   Neck: No JVD. Cardiac: RRR, no murmur, rub, or gallop.  Respiratory: Nonlabored. Clear to auscultation bilaterally. GI: Soft, nontender, bowel sounds present. MS: No edema. Neuro:  Nonfocal. Psych: Alert and oriented x 3. Normal affect.  Labs    Chemistry Recent Labs  Lab 08/09/21 1749 08/10/21 0108  NA 135 136  K 3.4* 3.8  CL 102 104  CO2 29 23  GLUCOSE 129* 127*  BUN 14 10   CREATININE 0.87 0.78  CALCIUM 9.0 9.0  GFRNONAA >60 >60  ANIONGAP 4* 9     Hematology Recent Labs  Lab 08/09/21 1749  WBC 10.5  RBC 5.35  HGB 17.3*  HCT 49.0  MCV 91.6  MCH 32.3  MCHC 35.3  RDW 12.5  PLT 216    Cardiac Enzymes Recent Labs  Lab 08/09/21 1749 08/09/21 2117 08/10/21 0338  TROPONINIHS 8 >24,000* >24,000*    Radiology    DG Chest 2 View  Result Date: 08/09/2021 CLINICAL DATA:  Acute chest pain today. EXAM: CHEST - 2 VIEW COMPARISON:  10/22/2004 chest radiograph FINDINGS: The cardiomediastinal silhouette is unchanged and unremarkable. Mild peribronchial thickening again noted. There is no evidence of focal airspace disease, pulmonary edema, suspicious pulmonary nodule/mass, pleural effusion, or pneumothorax. No acute bony abnormalities are identified. LOWER cervical fusion changes again noted. IMPRESSION: No active cardiopulmonary disease. Electronically Signed   By: Margarette Canada M.D.   On: 08/09/2021 18:34   CARDIAC CATHETERIZATION  Result Date: 08/10/2021   Prox LAD to Mid LAD lesion is 60% stenosed with 30% stenosed side branch in 2nd Diag.   Mid LAD-1 lesion is 100% stenosed just distal to 2nd Diag  A drug-eluting stent was successfully placed covering the upstream 60% stenosis and the original on her present thrombotic occlusion site, using a STENT ONYX FRONTIER 3.0X18.  Taper post dilation from 4.1 (prior to the side branch to 3.3 after the side branch.   Post intervention, there is a 0% residual stenosisn of the original occlusion site and upstream lesion.  Post intervention, the 2nd Diag side branch was reduced to 30% residual stenosis.   Mid LAD-2 lesion is 70% stenosed -> originally thought to be spasm, however appeared to be persistent despite intracoronary nitroglycerin and angioplasty.   A second, overlapping drug-eluting stent was successfully placed to cover the mid LAD 70% lesions and overlapping proximally, using a STENT ONYX FRONTIER 3.0X26.   Postdilated to 3.3 mm.   Post intervention, there is a 0% residual stenosis of the additional stented site.Marland Kitchen   --------------------------------------------------------------------------- ---------   Colon Flattery RCA lesion is 30% stenosed.   The remainder of the coronaries including the RCA and LCx are at least mild to moderately ectatic.   --------------------------------------------------------------------------- ---------   There is moderate to severe left ventricular systolic dysfunction.  The left ventricular ejection fraction is 30-35% by visual estimate.   LV end diastolic pressure is severely elevated - 22-24 mmhg   There is mild (2+) mitral regurgitation. SUMMARY Acute anterior ST elevation MI due to 100% occlusion of the mid LAD just after 2nd Diag branch. Successful PTCA-DES PCI of a long segment in the mid LAD using 2 overlapping Onyx Frontier DES stents: 3.0 mm x 18 mm, overlapped distally with a 3.0 mm x 26 mm for continuing disease initially felt to be spasm -> stents were postdilated to 4.1 mm proximal to the diagonal branch, and 3.  3 mm throughout the stented segment Generally large ectatic vessels with mild luminal irregularities throughout.  Very difficult to feel due to the ectasia and tortuosity of vessels, but no other significant lesions identified. 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 RECOMMENDATIONS CVICU admission.  Gentle post-cath hydration to avoid pulmonary edema given elevated LVEDP. Low threshold for diuresis Due to possible stunning of the myocardium, would recommend waiting until 08/11/2021 to check echocardiogram and assess EF.  If EF less than 35%, would consider LifeVest on discharge. Add low-dose carvedilol 3.125 mg twice daily starting tonight if pressures allow. If pressures are stable, consider ARB prior to discharge Delene Loll if indicated by LVEF reduction) Will start Farxiga (SGLT2 inhibitor)  High-dose high intensity statin Long-term DAPT Glenetta Hew, MD   Assessment & Plan    1.  Anterior STEMI with culprit lesion being occluded mid LAD just after second diagonal branch, now status post overlapping DES x2 on September 17.  LVEF approximately 35% at angiography with elevated LVEDP, echocardiogram pending.  Presently on aspirin, Brilinta, Coreg, and Lipitor.  2.  Type 2 diabetes mellitus, hemoglobin A1c 6.4%.  Started on Farxiga.  3.  Elevated blood pressure at presentation with trend improving.  Chart reviewed and situation discussed with patient and wife.  He feels much better today.  Ambulate with cardiac rehabilitation and transfer out to 6 E.  Obtain echocardiogram with eye toward further medical therapy optimization.  For now continue aspirin, Brilinta, Coreg, Lipitor, and [place].  If blood pressure tolerates, anticipate addition of low-dose Entresto, Aldactone can be added as a next step.  Signed, Rozann Lesches, MD  08/10/2021, 8:28 AM

## 2021-08-11 ENCOUNTER — Inpatient Hospital Stay (HOSPITAL_COMMUNITY): Payer: PRIVATE HEALTH INSURANCE

## 2021-08-11 ENCOUNTER — Encounter (HOSPITAL_COMMUNITY): Payer: Self-pay | Admitting: Cardiology

## 2021-08-11 ENCOUNTER — Other Ambulatory Visit (HOSPITAL_COMMUNITY): Payer: Self-pay

## 2021-08-11 DIAGNOSIS — I2102 ST elevation (STEMI) myocardial infarction involving left anterior descending coronary artery: Secondary | ICD-10-CM | POA: Diagnosis not present

## 2021-08-11 DIAGNOSIS — E118 Type 2 diabetes mellitus with unspecified complications: Secondary | ICD-10-CM

## 2021-08-11 DIAGNOSIS — E1169 Type 2 diabetes mellitus with other specified complication: Secondary | ICD-10-CM

## 2021-08-11 DIAGNOSIS — I5041 Acute combined systolic (congestive) and diastolic (congestive) heart failure: Secondary | ICD-10-CM

## 2021-08-11 DIAGNOSIS — E785 Hyperlipidemia, unspecified: Secondary | ICD-10-CM

## 2021-08-11 LAB — BASIC METABOLIC PANEL
Anion gap: 13 (ref 5–15)
BUN: 12 mg/dL (ref 6–20)
CO2: 22 mmol/L (ref 22–32)
Calcium: 9.2 mg/dL (ref 8.9–10.3)
Chloride: 101 mmol/L (ref 98–111)
Creatinine, Ser: 0.88 mg/dL (ref 0.61–1.24)
GFR, Estimated: >60 mL/min (ref >60)
Glucose, Bld: 115 mg/dL — ABNORMAL HIGH (ref 70–99)
Potassium: 3.7 mmol/L (ref 3.5–5.1)
Sodium: 136 mmol/L (ref 135–145)

## 2021-08-11 LAB — ECHOCARDIOGRAM COMPLETE
AR max vel: 4.08 cm2
AV Area VTI: 4.64 cm2
AV Area mean vel: 3.74 cm2
AV Mean grad: 3 mmHg
AV Peak grad: 4.8 mmHg
Ao pk vel: 1.09 m/s
Area-P 1/2: 3.72 cm2
Height: 70 in
S' Lateral: 2.8 cm
Weight: 3495.61 oz

## 2021-08-11 LAB — GLUCOSE, CAPILLARY
Glucose-Capillary: 147 mg/dL — ABNORMAL HIGH (ref 70–99)
Glucose-Capillary: 119 mg/dL — ABNORMAL HIGH (ref 70–99)

## 2021-08-11 LAB — CBC
HCT: 48.7 % (ref 39.0–52.0)
Hemoglobin: 17 g/dL (ref 13.0–17.0)
MCH: 31.5 pg (ref 26.0–34.0)
MCHC: 34.9 g/dL (ref 30.0–36.0)
MCV: 90.2 fL (ref 80.0–100.0)
Platelets: 187 10*3/uL (ref 150–400)
RBC: 5.4 MIL/uL (ref 4.22–5.81)
RDW: 12.4 % (ref 11.5–15.5)
WBC: 9.8 10*3/uL (ref 4.0–10.5)
nRBC: 0 % (ref 0.0–0.2)

## 2021-08-11 MED ORDER — METFORMIN HCL 500 MG PO TABS: ORAL_TABLET | ORAL | 1 refills | Status: DC

## 2021-08-11 MED ORDER — LOSARTAN POTASSIUM 25 MG PO TABS
12.5000 mg | ORAL_TABLET | Freq: Every day | ORAL | 5 refills | Status: DC
  Filled 2021-08-11: qty 30, 60d supply, fill #0

## 2021-08-11 MED ORDER — NITROGLYCERIN 0.4 MG SL SUBL
0.4000 mg | SUBLINGUAL_TABLET | SUBLINGUAL | 1 refills | Status: DC | PRN
  Filled 2021-08-11: qty 25, 7d supply, fill #0

## 2021-08-11 MED ORDER — DAPAGLIFLOZIN PROPANEDIOL 10 MG PO TABS
10.0000 mg | ORAL_TABLET | Freq: Every day | ORAL | 11 refills | Status: DC
  Filled 2021-08-11: qty 30, 30d supply, fill #0

## 2021-08-11 MED ORDER — ASPIRIN 81 MG PO TBEC
81.0000 mg | DELAYED_RELEASE_TABLET | Freq: Every day | ORAL | 3 refills | Status: DC
  Filled 2021-08-11: qty 90, 90d supply, fill #0

## 2021-08-11 MED ORDER — ATORVASTATIN CALCIUM 80 MG PO TABS
80.0000 mg | ORAL_TABLET | Freq: Every day | ORAL | 3 refills | Status: DC
  Filled 2021-08-11: qty 90, 90d supply, fill #0

## 2021-08-11 MED ORDER — CARVEDILOL 3.125 MG PO TABS
3.1250 mg | ORAL_TABLET | Freq: Two times a day (BID) | ORAL | 11 refills | Status: DC
  Filled 2021-08-11: qty 60, 30d supply, fill #0

## 2021-08-11 MED ORDER — TICAGRELOR 90 MG PO TABS
90.0000 mg | ORAL_TABLET | Freq: Two times a day (BID) | ORAL | 11 refills | Status: DC
  Filled 2021-08-11: qty 60, 30d supply, fill #0

## 2021-08-11 MED ORDER — LOSARTAN POTASSIUM 25 MG PO TABS
12.5000 mg | ORAL_TABLET | Freq: Every day | ORAL | Status: DC
  Administered 2021-08-11: 12.5 mg via ORAL
  Filled 2021-08-11: qty 1

## 2021-08-11 NOTE — Progress Notes (Signed)
CARDIAC REHAB PHASE I   PRE:  Rate/Rhythm: 85 SR  BP:  Supine: 107/69  Sitting:   Standing:    SaO2: 96%RA  MODE:  Ambulation: 600 ft   POST:  Rate/Rhythm: 90 SR  BP:  Supine:   Sitting: 107/81  Standing:    SaO2: 97%RA 0837-0940 Pt walked 600 ft on RA with steady gait and tolerated well. No CP. MI education completed with pt and wife. Reviewed importance of brilinta with stent. Reviewed NTG use, MI restrictions, walking for ex, heart healthy and low carb food choices, and CRP 2. Gave low sodium handouts and discussed 2000 mg restriction. Discussed daily weights and signs of CHF and gave CHF booklet since EF low on cath. Awaiting ECHO. Referral to [place] CRP 2. Pt and wife voiced understanding of ed.   Graylon Good, RN BSN  08/11/2021 9:35 AM

## 2021-08-11 NOTE — Progress Notes (Signed)
  Echocardiogram 2D Echocardiogram has been performed.  Jeff Mccarthy 08/11/2021, 10:25 AM

## 2021-08-11 NOTE — Discharge Summary (Addendum)
Discharge Summary    Patient ID: Jeff Mccarthy MRN: [id number]; DOB: 26-Mar-1966  Admit date: 08/09/2021 Discharge date: 08/11/2021  PCP:  Erven Colla, DO   [place] Providers Cardiologist:  Glenetta Hew, MD   {  Discharge Diagnoses    Principal Problem:   Acute ST elevation myocardial infarction (STEMI) due to occlusion of left anterior descending (LAD) coronary artery 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  Per Dr. Harrington Challenger who is reading echocardiogram EF is 40% with wall motion abnormality.   Coronary/Graft Acute MI Revascularization  CORONARY STENT INTERVENTION  LEFT HEART CATH AND CORONARY ANGIOGRAPHY   Conclusion      Prox LAD to Mid LAD lesion is 60% stenosed with 30% stenosed side branch in 2nd Diag.   Mid LAD-1 lesion is 100% stenosed just distal to 2nd Diag   A drug-eluting stent was successfully placed covering the upstream 60% stenosis and the original on her present thrombotic occlusion site, using a STENT ONYX FRONTIER 3.0X18.  Taper post dilation from 4.1 (prior to the side branch to 3.3 after the side branch.   Post intervention, there is a 0% residual stenosisn of the original occlusion site and upstream lesion.  Post intervention, the 2nd Diag side branch was reduced to 30% residual stenosis.   Mid LAD-2 lesion is 70% stenosed -> originally thought to be spasm, however appeared to be persistent despite intracoronary nitroglycerin and angioplasty.   A second, overlapping drug-eluting stent was successfully placed to cover the mid LAD 70% lesions and overlapping proximally, using a STENT ONYX FRONTIER 3.0X26.  Postdilated to  3.3 mm.   Post intervention, there is a 0% residual stenosis of the additional stented site.Marland Kitchen   ------------------------------------------------------------------------------------   Colon Flattery RCA lesion is 30% stenosed.   The remainder of the coronaries including the RCA and LCx are at least mild to moderately ectatic.   ------------------------------------------------------------------------------------   There is moderate to severe left ventricular systolic dysfunction.  The left ventricular ejection fraction is 30-35% by visual estimate.   LV end diastolic pressure is severely elevated - 22-24 mmhg   There is mild (2+) mitral regurgitation.   SUMMARY Acute anterior ST elevation MI due to 100% occlusion of the mid LAD just after 2nd Diag branch. Successful PTCA-DES PCI of a long segment in the mid LAD using 2 overlapping Onyx Frontier DES stents: 3.0 mm x 18 mm, overlapped distally with a 3.0 mm x 26 mm for continuing disease initially felt to be spasm -> stents were postdilated to 4.1 mm proximal to the diagonal branch, and 3.  3 mm throughout the stented segment Generally large ectatic vessels with mild luminal irregularities throughout.  Very difficult to feel due to the ectasia and tortuosity of vessels, but no other significant lesions identified. 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   RECOMMENDATIONS CVICU admission.  Gentle post-cath hydration to avoid pulmonary edema given elevated LVEDP. Low threshold for diuresis Due to possible stunning of the myocardium, would recommend waiting until 08/11/2021 to check echocardiogram and assess EF.  If EF less than 35%, would consider LifeVest on discharge. Add  low-dose carvedilol 3.125 mg twice daily starting tonight if pressures allow. If pressures are stable, consider ARB prior to discharge Delene Loll if indicated by LVEF reduction) Will start Farxiga (SGLT2  inhibitor) High-dose high intensity statin Long-term DAPT    Diagnostic Dominance: Right Intervention   History of Present Illness     [name] is a 55 y.o. male with hx of DM and tobacco use who is presented for the evaluation of chest pain.   [name] presented to the emergency department for evaluation of chest pain.  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  States he has never had pain like this before.  Pain is associated with shortness of breath.  Denies abdominal pain, diarrhea, cough, fever or other systemic symptoms.  Pain is nonradiating and acute, 10/10. His EKG showed ST depression in the inferior leads and subtle STE in V1/V2. StEMI was activated and brought to [facility] cone. At arrival, he has 3/10 chest pain and feels better. No previous h/o CAD or heart cath.  Hospital Course     Consultants: None   STEMI - Emergent cath as above showing 60% pLAD, 100% mLAD-1 lesion and 70% mLAD-2 lesion s/p DES PCI with overlapping stent covering all three lesions. LVEF 30-35% nad LVEDP 22-76mH by cath.  - No recurrent chest pain. Ambulated well. CRP II - Continue DAPT with ASA and Brillinta - Continue Statin and BB  2. ICM - LVEF 30-35% nad LVEDP 22-232m by cath.  - Euvolemic by exam - Echo>>pending final reading due to technical issue.  Per Dr. RoHarrington ChallengerF is 40% with wall motion abnormality who is going to read formally once computer issue resolves - Continue Coreg 3.'125mg'$  BID - Add Losartan 12.'5mg'$  qd >> [facility]. Consider SpArlyce Harmans well. - Continue FaWilder Glade Discussed low sodium diet  3. HLD - 08/10/2021: Cholesterol 128; HDL 34; LDL Cholesterol 61; Triglycerides 164; VLDL 33  - Continue high intensity statin  - Lipid panel and LFTS in 8 weeks    Did the patient have an acute  coronary syndrome (MI, NSTEMI, STEMI, etc) this admission?:  Yes                               AHA/ACC Clinical Performance & Quality Measures: Aspirin prescribed? - Yes ADP Receptor Inhibitor (Plavix/Clopidogrel, Brilinta/Ticagrelor or Effient/Prasugrel) prescribed (includes medically managed patients)? - Yes Beta Blocker prescribed? - Yes High Intensity Statin (Lipitor 40-'80mg'$  or Crestor 20-'40mg'$ ) prescribed? - Yes EF assessed during THIS hospitalization? - Yes For EF <40%, was ACEI/ARB prescribed? - Yes For EF <40%, Aldosterone Antagonist (Spironolactone or Eplerenone) prescribed? - No - Reason:  Consider as outpatient, BP is soft here Cardiac Rehab Phase II ordered (including medically managed patients)? - Yes     Discharge Vitals Blood pressure 105/75, pulse 70, temperature 97.8 F (36.6 C), temperature source Oral, resp. rate 17, height '5\' 10"'$  (1.778 m), weight 99.1 kg, SpO2 97 %.  Filed Weights   08/09/21 1758 08/09/21 2055  Weight: 98.9 kg 99.1 kg   Physical Exam Constitutional:      Appearance: He is well-developed.  Eyes:     Extraocular Movements: Extraocular movements intact.     Pupils: Pupils are equal, round, and reactive to light.  Cardiovascular:  Rate and Rhythm: Normal rate and regular rhythm.     Heart sounds: Normal heart sounds.     Comments: Right radial cath site without hematoma  Pulmonary:     Effort: Pulmonary effort is normal.     Breath sounds: Normal breath sounds.  Musculoskeletal:        General: Normal range of motion.     Cervical back: Normal range of motion and neck supple.  Skin:    General: Skin is warm and dry.  Neurological:     General: No focal deficit present.     Mental Status: He is alert and oriented to person, place, and time.  Psychiatric:        Mood and Affect: Mood normal.        Behavior: Behavior normal.    Labs & Radiologic Studies    CBC Recent Labs    08/10/21 0108 08/11/21 0313  WBC 10.7* 9.8  HGB 16.0  17.0  HCT 45.9 48.7  MCV 90.0 90.2  PLT 183 [id number]   Basic Metabolic Panel Recent Labs    08/10/21 0108 08/11/21 0313  NA 136 136  K 3.8 3.7  CL 104 101  CO2 23 22  GLUCOSE 127* 115*  BUN 10 12  CREATININE 0.78 0.88  CALCIUM 9.0 9.2    High Sensitivity Troponin:   Recent Labs  Lab 08/09/21 1749 08/09/21 2117 08/10/21 0338 08/10/21 0846  TROPONINIHS 8 >24,000* >24,000* >24,000*     Hemoglobin A1C Recent Labs    08/10/21 0108  HGBA1C 6.4*   Fasting Lipid Panel Recent Labs    08/10/21 0108  CHOL 128  HDL 34*  LDLCALC 61  TRIG 164*  CHOLHDL 3.8    _____________  DG Chest 2 View  Result Date: 08/09/2021 CLINICAL DATA:  Acute chest pain today. EXAM: CHEST - 2 VIEW COMPARISON:  10/22/2004 chest radiograph FINDINGS: The cardiomediastinal silhouette is unchanged and unremarkable. Mild peribronchial thickening again noted. There is no evidence of focal airspace disease, pulmonary edema, suspicious pulmonary nodule/mass, pleural effusion, or pneumothorax. No acute bony abnormalities are identified. LOWER cervical fusion changes again noted. IMPRESSION: No active cardiopulmonary disease. Electronically Signed   By: Margarette Canada M.D.   On: 08/09/2021 18:34   CARDIAC CATHETERIZATION  Result Date: 08/10/2021   Prox LAD to Mid LAD lesion is 60% stenosed with 30% stenosed side branch in 2nd Diag.   Mid LAD-1 lesion is 100% stenosed just distal to 2nd Diag   A drug-eluting stent was successfully placed covering the upstream 60% stenosis and the original on her present thrombotic occlusion site, using a STENT ONYX FRONTIER 3.0X18.  Taper post dilation from 4.1 (prior to the side branch to 3.3 after the side branch.   Post intervention, there is a 0% residual stenosisn of the original occlusion site and upstream lesion.  Post intervention, the 2nd Diag side branch was reduced to 30% residual stenosis.   Mid LAD-2 lesion is 70% stenosed -> originally thought to be spasm, however appeared  to be persistent despite intracoronary nitroglycerin and angioplasty.   A second, overlapping drug-eluting stent was successfully placed to cover the mid LAD 70% lesions and overlapping proximally, using a STENT ONYX FRONTIER 3.0X26.  Postdilated to 3.3 mm.   Post intervention, there is a 0% residual stenosis of the additional stented site.Marland Kitchen   --------------------------------------------------------------------------- ---------   Colon Flattery RCA lesion is 30% stenosed.   The remainder of the coronaries including the RCA and LCx are at least mild  to moderately ectatic.   --------------------------------------------------------------------------- ---------   There is moderate to severe left ventricular systolic dysfunction.  The left ventricular ejection fraction is 30-35% by visual estimate.   LV end diastolic pressure is severely elevated - 22-24 mmhg   There is mild (2+) mitral regurgitation. SUMMARY Acute anterior ST elevation MI due to 100% occlusion of the mid LAD just after 2nd Diag branch. Successful PTCA-DES PCI of a long segment in the mid LAD using 2 overlapping Onyx Frontier DES stents: 3.0 mm x 18 mm, overlapped distally with a 3.0 mm x 26 mm for continuing disease initially felt to be spasm -> stents were postdilated to 4.1 mm proximal to the diagonal branch, and 3.  3 mm throughout the stented segment Generally large ectatic vessels with mild luminal irregularities throughout.  Very difficult to feel due to the ectasia and tortuosity of vessels, but no other significant lesions identified. 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 RECOMMENDATIONS CVICU admission.  Gentle post-cath hydration to avoid pulmonary edema given elevated LVEDP. Low threshold for diuresis Due to possible stunning of the myocardium, would recommend waiting until 08/11/2021 to check echocardiogram and assess EF.  If EF less than 35%, would consider LifeVest on  discharge. Add low-dose carvedilol 3.125 mg twice daily starting tonight if pressures allow. If pressures are stable, consider ARB prior to discharge Delene Loll if indicated by LVEF reduction) Will start Farxiga (SGLT2 inhibitor) High-dose high intensity statin Long-term DAPT Glenetta Hew, MD  Disposition   Pt is being discharged home today in good condition.  Follow-up Plans & Appointments     Follow-up Information     Deberah Pelton, NP Follow up on 08/19/2021.   Specialty: Cardiology Why: '@8'$ :45am for hospital follow up Contact information: [address] [place] [address] [phone]                Discharge Instructions     Amb Referral to Cardiac Rehabilitation   Complete by: As directed    Diagnosis:  Coronary Stents STEMI     After initial evaluation and assessments completed: Virtual Based Care may be provided alone or in conjunction with Phase 2 Cardiac Rehab based on patient barriers.: Yes   Diet - low sodium heart healthy   Complete by: As directed    Discharge instructions   Complete by: As directed    No driving for 2 weeks. No lifting over 10 lbs for 4 weeks. No sexual activity for 4 weeks. You may not return to work until cleared by your cardiologist. Keep procedure site clean & dry. If you notice increased pain, swelling, bleeding or pus, call/return!  You may shower, but no soaking baths/hot tubs/pools for 1 week.   Increase activity slowly   Complete by: As directed        Discharge Medications   Allergies as of 08/11/2021       Reactions   Biaxin [clarithromycin] Other (See Comments)   GI upset        Medication List     TAKE these medications    aspirin 81 MG EC tablet Take 1 tablet (81 mg total) by mouth daily.   atorvastatin 80 MG tablet Commonly known as: LIPITOR Take 1 tablet (80 mg total) by mouth daily. What changed:  medication strength how much to take   carvedilol 3.125 MG tablet Commonly known as:  COREG Take 1 tablet (3.125 mg total)  by mouth 2 (two) times daily with a meal.   dapagliflozin propanediol 10 MG Tabs tablet Commonly known as: FARXIGA Take 1 tablet (10 mg total) by mouth daily.   losartan 25 MG tablet Commonly known as: Cozaar Take 1/2 tablet (12.5 mg total) by mouth daily.   metFORMIN 500 MG tablet Commonly known as: GLUCOPHAGE Take 500 mg by mouth 2 (two) times daily with a meal. What changed: Another medication with the same name was changed. Make sure you understand how and when to take each.   metFORMIN 500 MG tablet Commonly known as: GLUCOPHAGE Take one tablet po BID with meals Start taking on: August 12, 2021 What changed:  how much to take how to take this when to take this additional instructions   nitroGLYCERIN 0.4 MG SL tablet Commonly known as: NITROSTAT Place 1 tablet (0.4 mg total) under the tongue every 5 (five) minutes as needed for chest pain.   ticagrelor 90 MG Tabs tablet Commonly known as: BRILINTA Take 1 tablet (90 mg total) by mouth 2 (two) times daily.           Outstanding Labs/Studies   Consider OP f/u labs 6-8 weeks given statin initiation this admission.   Duration of Discharge Encounter   Greater than 30 minutes including physician time.  SignedCrista Luria What Cheer, PA 08/11/2021, 2:01 PM  I have examined the patient and reviewed assessment and plan and discussed with patient.  Agree with above as stated.    GEN: Well nourished, well developed, in no acute distress  HEENT: normal  Neck: no JVD, carotid bruits, or masses Cardiac: RRR; no murmurs, rubs, or gallops,no edema  Respiratory:  clear to auscultation bilaterally, normal work of breathing GI: soft, nontender, nondistended,  MS: no deformity or atrophy , no hematoma at right wrist Skin: warm and dry, no rash Neuro:  Strength and sensation are intact Psych: euthymic mood, full affect  S/p PCI for STEMI.  Medical therapy for LV dysfunction.  Apical  hypokinesis by echo.  I personally reviewed the images as the study was being done.  DAPT, high dose statin, ARB, beta blocker.  WIll try to up grade to [facility] and Aldactone as an outpatient.  Considering farxiga as well.   He was very appreciative of the care he received.  He will try cardiac rehab.    Larae Grooms

## 2021-08-11 NOTE — TOC Benefit Eligibility Note (Signed)
Patient Teacher, English as a foreign language completed.    The patient is currently admitted and upon discharge could be taking Brilinta 90 mg.  The current 30 day co-pay is, $30.00.   The patient is currently admitted and upon discharge could be taking Jardiance 10 mg.  The current 30 day co-pay is, $30.00.   The patient is currently admitted and upon discharge could be taking Farxiga 10 mg.  The current 30 day co-pay is, $15.00. [place] 10 MG TABLET paid 15.00 toward plan copay   The patient is insured through [place] Patient Advocate Specialist [place] Team Direct Number: [phone]  Fax: [phone]

## 2021-08-13 ENCOUNTER — Telehealth: Payer: Self-pay

## 2021-08-13 NOTE — Telephone Encounter (Signed)
Transition Care Management Follow-up Telephone Call Date of discharge and from where: 08/11/2021  [facility] How have you been since you were released from the hospital? Feeling good. Soreness in chest but MD stated this was normal Any questions or concerns? No  Items Reviewed: Did the pt receive and understand the discharge instructions provided? Yes  Medications obtained and verified? Yes  Other? No  Any new allergies since your discharge? No  Dietary orders reviewed? Yes Do you have support at home? Yes   Home Care and Equipment/Supplies: Were home health services ordered? no If so, what is the name of the agency? Has the agency set up a time to come to the patient's home?  Were any new equipment or medical supplies ordered?  No What is the name of the medical supply agency?  Were you able to get the supplies/equipment? not applicable Do you have any questions related to the use of the equipment or supplies? No  Functional Questionnaire: (I = Independent and D = Dependent) ADLs: I  Bathing/Dressing- I  Meal Prep- I  Eating- I  Maintaining continence- I  Transferring/Ambulation- I  Managing Meds- I  Follow up appointments reviewed:  PCP Hospital f/u appt confirmed?   [facility] f/u appt confirmed?  Scheduled to see Coletta Memos NP on 08/19/2021 @ 0845. Are transportation arrangements needed? No  If their condition worsens, is the pt aware to call PCP or go to the Emergency Dept.? Yes Was the patient provided with contact information for the PCP's office or ED? Yes Was to pt encouraged to call back with questions or concerns? Yes  Suggested patient get a BP cuff to self monitor and record readings  Tomasa Rand, RN, BSN, [facility] [employer] [phone]

## 2021-08-18 NOTE — Progress Notes (Signed)
Cardiology Clinic Note   Patient Name: Jeff Mccarthy Date of Encounter: 08/19/2021  Primary Care Provider:  Erven Colla, DO Primary Cardiologist:  Glenetta Hew, MD  Patient Profile    Jeff Mccarthy 55 year old male presents the clinic today for follow-up evaluation post STEMI  Past Medical History    Past Medical History:  Diagnosis Date   Diabetes mellitus without complication [facility])    Past Surgical History:  Procedure Laterality Date   COLONOSCOPY     COLONOSCOPY N/A 07/08/2018   Procedure: COLONOSCOPY;  Surgeon: Daneil Dolin, MD;  [facility];  Service: Endoscopy;  Laterality: N/A;  12:15   COLONOSCOPY N/A 07/13/2018   Procedure: COLONOSCOPY;  Surgeon: Daneil Dolin, MD;  [facility];  Service: Endoscopy;  Laterality: N/A;   CORONARY STENT INTERVENTION N/A 08/09/2021   Procedure: CORONARY STENT INTERVENTION;  Surgeon: Leonie Man, MD;  Location: [place] CV LAB;  Service: Cardiovascular;  Laterality: N/A;   CORONARY/GRAFT ACUTE MI REVASCULARIZATION N/A 08/09/2021   Procedure: Coronary/Graft Acute MI Revascularization;  Surgeon: Leonie Man, MD;  Location: [place] CV LAB;  Service: Cardiovascular;  Laterality: N/A;   LEFT HEART CATH AND CORONARY ANGIOGRAPHY N/A 08/09/2021   Procedure: LEFT HEART CATH AND CORONARY ANGIOGRAPHY;  Surgeon: Leonie Man, MD;  Location: [place] CV LAB;  Service: Cardiovascular;  Laterality: N/A;   NECK SURGERY     C5, C6   POLYPECTOMY     POLYPECTOMY  07/08/2018   Procedure: POLYPECTOMY;  Surgeon: Daneil Dolin, MD;  [facility];  Service: Endoscopy;;  cecal, hepatic flexure,    Allergies  Allergies  Allergen Reactions   Biaxin [Clarithromycin] Other (See Comments)    GI upset    History of Present Illness    Jeff Mccarthy he has a PMH of hyperlipidemia, ischemic cardiomyopathy LVEF 30-35%, and coronary artery disease status post STEMI.  He underwent cardiac  catheterization on 08/09/2021 which showed proximal-mid LAD 60% stenosis, 30% stenosis second diagonal, and mid LAD lesion 100% stenosis just distal to second-degree diagonal.  He received PCI with DES x2.  He was placed on carvedilol, aspirin, atorvastatin, Farxiga, and Brilinta.  His echocardiogram 08/11/2021 showed a slight improvement in his LVEF up to 35-40% with severe hypokinesis to akinesis of the distal inferior mid to distal inferior septal and apical walls.  Who presents the clinic today for follow-up evaluation states he feels well and has been slowly increasing his walking.  He did initially note some dull aching in his chest.  He reports that he felt dull ache during PCI.  This is now resolved.  He reports compliance with his medications and denies side effects.  We reviewed his angiography and his echocardiogram.  He and his wife expressed understanding.  I will increase his losartan to 25 mg daily, continue his blood pressure log, give a salty 6 diet sheet, and have him follow-up in 1 month.  We will repeat his fasting lipids and LFTs in 4 to 6 weeks.  I will also repeat a BMP in 1 week.  Today he denies chest pain, shortness of breath, lower extremity edema, fatigue, palpitations, melena, hematuria, hemoptysis, diaphoresis, weakness, presyncope, syncope, orthopnea, and PND.     Home Medications    Prior to Admission medications   Medication Sig Start Date End Date Taking? Authorizing Provider  aspirin 81 MG EC tablet Take 1 tablet (81 mg total) by mouth daily. 08/11/21 08/11/22  Leonie Man, MD  atorvastatin (LIPITOR) 80 MG tablet Take 1 tablet (80 mg total) by mouth daily. 08/11/21   Leonie Man, MD  carvedilol (COREG) 3.125 MG tablet Take 1 tablet (3.125 mg total) by mouth 2 (two) times daily with a meal. 08/11/21   Leonie Man, MD  dapagliflozin propanediol (FARXIGA) 10 MG TABS tablet Take 1 tablet (10 mg total) by mouth daily. 08/11/21   Leonie Man, MD  losartan  (COZAAR) 25 MG tablet Take 1/2 tablet (12.5 mg total) by mouth daily. 08/11/21 08/11/22  Leonie Man, MD  metFORMIN (GLUCOPHAGE) 500 MG tablet Take one tablet po BID with meals 08/12/21   Leonie Man, MD  metFORMIN (GLUCOPHAGE) 500 MG tablet Take 500 mg by mouth 2 (two) times daily with a meal.    [provider]  nitroGLYCERIN (NITROSTAT) 0.4 MG SL tablet Place 1 tablet (0.4 mg total) under the tongue every 5 (five) minutes as needed for chest pain. 08/11/21   Leonie Man, MD  ticagrelor (BRILINTA) 90 MG TABS tablet Take 1 tablet (90 mg total) by mouth 2 (two) times daily. 08/11/21   Leonie Man, MD    Family History    History reviewed. No pertinent family history. has no family status information on file.   Social History    Social History   Socioeconomic History   Marital status: Married    Spouse name: Not on file   Number of children: Not on file   Years of education: Not on file   Highest education level: Not on file  Occupational History   Not on file  Tobacco Use   Smoking status: Former    Types: Cigarettes    Quit date: 11/22/2014    Years since quitting: 6.7   Smokeless tobacco: Never  Vaping Use   Vaping Use: Never used  Substance and Sexual Activity   Alcohol use: Yes   Drug use: Never   Sexual activity: Not on file  Other Topics Concern   Not on file  Social History Narrative   Not on file   Social Determinants of Health   Financial Resource Strain: Not on file  Food Insecurity: Not on file  Transportation Needs: Not on file  Physical Activity: Not on file  Stress: Not on file  Social Connections: Not on file  Intimate Partner Violence: Not on file     Review of Systems    General:  No chills, fever, night sweats or weight changes.  Cardiovascular:  No chest pain, dyspnea on exertion, edema, orthopnea, palpitations, paroxysmal nocturnal dyspnea. Dermatological: No rash, lesions/masses Respiratory: No cough,  dyspnea Urologic: No hematuria, dysuria Abdominal:   No nausea, vomiting, diarrhea, bright red blood per rectum, melena, or hematemesis Neurologic:  No visual changes, wkns, changes in mental status. All other systems reviewed and are otherwise negative except as noted above.  Physical Exam    VS:  BP 110/80 (BP Location: Left Arm, Patient Position: Sitting, Cuff Size: Normal)   Pulse 67   Ht 5\' 10"  (1.778 m)   Wt 219 lb 6.4 oz (99.5 kg)   SpO2 97%   BMI 31.48 kg/m  , BMI Body mass index is 31.48 kg/m. GEN: Well nourished, well developed, in no acute distress. HEENT: normal. Neck: Supple, no JVD, carotid bruits, or masses. Cardiac: RRR, no murmurs, rubs, or gallops. No clubbing, cyanosis, edema.  Radials/DP/PT 2+ and equal bilaterally.  Respiratory:  Respirations regular and unlabored, clear to  auscultation bilaterally. GI: Soft, nontender, nondistended, BS + x 4. MS: no deformity or atrophy. Skin: warm and dry, no rash.  Right radial cath site ecchymosis, clean dry intact no drainage Neuro:  Strength and sensation are intact. Psych: Normal affect.  Accessory Clinical Findings    Recent Labs: 07/02/2021: ALT 24 08/11/2021: BUN 12; Creatinine, Ser 0.88; Hemoglobin 17.0; Platelets 187; Potassium 3.7; Sodium 136   Recent Lipid Panel    Component Value Date/Time   CHOL 128 08/10/2021 0108   CHOL 182 04/03/2021 0910   TRIG 164 (H) 08/10/2021 0108   HDL 34 (L) 08/10/2021 0108   HDL 39 (L) 04/03/2021 0910   CHOLHDL 3.8 08/10/2021 0108   VLDL 33 08/10/2021 0108   LDLCALC 61 08/10/2021 0108   LDLCALC 104 (H) 04/03/2021 0910    ECG personally reviewed by me today-normal sinus rhythm low voltage QRS atrial septal infarct undetermined age 88 bpm- No acute changes  Echocardiogram 08/11/2021 IMPRESSIONS     1. Severe hypokinesis/Akinesis of the distal inferior, mid/distal  inferoseptal and apical walls. . Left ventricular ejection fraction, by  estimation, is 35 to 40%. The left  ventricle has moderately decreased  function. There is severe asymmetric left  ventricular hypertrophy. Left ventricular diastolic parameters are  consistent with Grade I diastolic dysfunction (impaired relaxation).  Elevated left atrial pressure.   2. Right ventricular systolic function is normal. The right ventricular  size is normal.   3. The mitral valve is normal in structure. Trivial mitral valve  regurgitation.   4. The aortic valve is normal in structure. Aortic valve regurgitation is  not visualized.   5. There is mild dilatation of the aortic root, measuring 38 mm.  Cardiac catheterization 08/09/2021   Prox LAD to Mid LAD lesion is 60% stenosed with 30% stenosed side branch in 2nd Diag.   Mid LAD-1 lesion is 100% stenosed just distal to 2nd Diag   A drug-eluting stent was successfully placed covering the upstream 60% stenosis and the original on her present thrombotic occlusion site, using a STENT ONYX FRONTIER 3.0X18.  Taper post dilation from 4.1 (prior to the side branch to 3.3 after the side branch.   Post intervention, there is a 0% residual stenosisn of the original occlusion site and upstream lesion.  Post intervention, the 2nd Diag side branch was reduced to 30% residual stenosis.   Mid LAD-2 lesion is 70% stenosed -> originally thought to be spasm, however appeared to be persistent despite intracoronary nitroglycerin and angioplasty.   A second, overlapping drug-eluting stent was successfully placed to cover the mid LAD 70% lesions and overlapping proximally, using a STENT ONYX FRONTIER 3.0X26.  Postdilated to 3.3 mm.   Post intervention, there is a 0% residual stenosis of the additional stented site.Marland Kitchen   ------------------------------------------------------------------------------------   Colon Flattery RCA lesion is 30% stenosed.   The remainder of the coronaries including the RCA and LCx are at least mild to moderately ectatic.    ------------------------------------------------------------------------------------   There is moderate to severe left ventricular systolic dysfunction.  The left ventricular ejection fraction is 30-35% by visual estimate.   LV end diastolic pressure is severely elevated - 22-24 mmhg   There is mild (2+) mitral regurgitation.   SUMMARY Acute anterior ST elevation MI due to 100% occlusion of the mid LAD just after 2nd Diag branch. Successful PTCA-DES PCI of a long segment in the mid LAD using 2 overlapping Onyx Frontier DES stents: 3.0 mm x 18 mm, overlapped distally with a 3.0  mm x 26 mm for continuing disease initially felt to be spasm -> stents were postdilated to 4.1 mm proximal to the diagonal branch, and 3.  3 mm throughout the stented segment Generally large ectatic vessels with mild luminal irregularities throughout.  Very difficult to feel due to the ectasia and tortuosity of vessels, but no other significant lesions identified. 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   RECOMMENDATIONS CVICU admission.  Gentle post-cath hydration to avoid pulmonary edema given elevated LVEDP. Low threshold for diuresis Due to possible stunning of the myocardium, would recommend waiting until 08/11/2021 to check echocardiogram and assess EF.  If EF less than 35%, would consider LifeVest on discharge. Add low-dose carvedilol 3.125 mg twice daily starting tonight if pressures allow. If pressures are stable, consider ARB prior to discharge Delene Loll if indicated by LVEF reduction) Will start Farxiga (SGLT2 inhibitor) High-dose high intensity statin Long-term DAPT       Glenetta Hew, MD  Diagnostic Dominance: Right Intervention    Assessment & Plan   1.  STEMI-denies further episodes of arm neck back or chest discomfort.  Underwent cardiac catheterization on 08/09/2021 and received DES x2 to his mid LAD.   Echocardiogram showed a slight improvement in his LVEF up to 35-40%. Continue carvedilol, aspirin, Brilinta, Farxiga, atorvastatin Heart healthy low-sodium diet-salty 6 given Increase physical activity as tolerated  Ischemic cardiomyopathy-reports slow increases in his physical activity.  Denies increased DOE. Increase losartan to 25 mg daily Continue carvedilol, Farxiga Repeat BMP in 1 week We will continue to increase guideline directed medical therapy and repeat echocardiogram once medication has been optimized.  Hyperlipidemia-08/10/2021: Cholesterol 128; HDL 34; LDL Cholesterol 61; Triglycerides 164; VLDL 33 Continue atorvastatin, aspirin Heart healthy low-sodium high-fiber diet Increase physical activity as tolerated Repeat fasting lipids and LFTs in 4 to 6 weeks  Type 2 diabetes-glucose 115 on 08/11/2021 Continue metformin, [place] Cancel with PCP  Disposition: Follow-up with me or Dr. Ellyn Hack in 1 month.  Jossie Ng. Nylee Barbuto NP-C    08/19/2021, 9:14 AM [place] [address] Office [phone] Fax [phone]  Notice: This dictation was prepared with Dragon dictation along with smaller phrase technology. Any transcriptional errors that result from this process are unintentional and may not be corrected upon review.  I spent 13 minutes examining this patient, reviewing medications, and using patient centered shared decision making involving her cardiac care.  Prior to her visit I spent greater than 20 minutes reviewing her past medical history,  medications, and prior cardiac tests.

## 2021-08-19 ENCOUNTER — Encounter: Payer: Self-pay | Admitting: General Practice

## 2021-08-19 ENCOUNTER — Ambulatory Visit (INDEPENDENT_AMBULATORY_CARE_PROVIDER_SITE_OTHER): Payer: No Typology Code available for payment source | Admitting: General Practice

## 2021-08-19 ENCOUNTER — Other Ambulatory Visit: Payer: Self-pay

## 2021-08-19 VITALS — BP 110/80 | HR 67 | Ht 70.0 in | Wt 219.4 lb

## 2021-08-19 DIAGNOSIS — Z79899 Other long term (current) drug therapy: Secondary | ICD-10-CM

## 2021-08-19 DIAGNOSIS — I2102 ST elevation (STEMI) myocardial infarction involving left anterior descending coronary artery: Secondary | ICD-10-CM | POA: Diagnosis not present

## 2021-08-19 DIAGNOSIS — I255 Ischemic cardiomyopathy: Secondary | ICD-10-CM | POA: Diagnosis not present

## 2021-08-19 DIAGNOSIS — E118 Type 2 diabetes mellitus with unspecified complications: Secondary | ICD-10-CM

## 2021-08-19 DIAGNOSIS — E785 Hyperlipidemia, unspecified: Secondary | ICD-10-CM

## 2021-08-19 MED ORDER — LOSARTAN POTASSIUM 25 MG PO TABS: 25.0000 mg | ORAL_TABLET | Freq: Every day | ORAL | 5 refills | Status: DC

## 2021-08-19 NOTE — Patient Instructions (Signed)
Medication Instructions:  INCREASE LOSARTAN 25MG  DAILY(1 WHOLE TAB)  *If you need a refill on your cardiac medications before your next appointment, please call your pharmacy*  Lab Work: BMET IN 1 WEEK AND  FASTING LIPID AND LFT IN 4-6 WEEKS If you have labs (blood work) drawn today and your tests are completely normal, you will receive your results only by:  [place] (if you have MyChart) OR A paper copy in the mail.  If you have any lab test that is abnormal or we need to change your treatment, we will call you to review the results. You may go to any [facility] that is convenient for you however, we do have a lab in our office that is able to assist you. You DO NOT need an appointment for our lab. The lab is open 8:00am and closes at 4:00pm. Lunch 12:45 - 1:45pm.  Special Instructions PLEASE READ AND FOLLOW SALTY 6-ATTACHED-1,800 mg daily  PLEASE INCREASE PHYSICAL ACTIVITY AS TOLERATED  Follow-Up: Your next appointment:  1 month(s) In Person with Glenetta Hew, MD OR IF UNAVAILABLE [place], FNP-C   At [facility], you and your health needs are our priority.  As part of our continuing mission to provide you with exceptional heart care, we have created designated Provider Care Teams.  These Care Teams include your primary Cardiologist (physician) and Advanced Practice Providers (APPs -  Physician Assistants and Nurse Practitioners) who all work together to provide you with the care you need, when you need it.

## 2021-08-27 LAB — BASIC METABOLIC PANEL
BUN/Creatinine Ratio: 18 (ref 9–20)
BUN: 17 mg/dL (ref 6–24)
CO2: 18 mmol/L — ABNORMAL LOW (ref 20–29)
Calcium: 9.6 mg/dL (ref 8.7–10.2)
Chloride: 100 mmol/L (ref 96–106)
Creatinine, Ser: 0.96 mg/dL (ref 0.76–1.27)
Glucose: 97 mg/dL (ref 70–99)
Potassium: 4.2 mmol/L (ref 3.5–5.2)
Sodium: 138 mmol/L (ref 134–144)
eGFR: 95 mL/min/{1.73_m2} (ref >59)

## 2021-08-29 ENCOUNTER — Telehealth: Payer: Self-pay | Admitting: Pharmacist

## 2021-08-29 NOTE — Telephone Encounter (Signed)
Pharmacy Transitions of Care Follow-up Telephone Call  Date of discharge: 08/11/2021  Discharge Diagnosis: STEMI  How have you been since you were released from the hospital? Jeff Mccarthy is doing great since his hospital discharge, back to work with no limitation.  Medication changes made at discharge:  - START:  Carvedilol 3.125mg   Losartan 12.5mg   Ticagrelor 90mg   Atorvastatin 80mg   Dapagliflozin 10mg   Nitroglycerin 0.4mg   - STOPPED: n/a  - CHANGED: n/a  Medication changes verified by the patient? Yes    Medication Accessibility:  Home Pharmacy: not discussed   Was the patient provided with refills on discharged medications? yes   Have all prescriptions been transferred from [facility] to home pharmacy? N/a   Is the patient able to afford medications? Not discussed Notable copays: no Eligible patient assistance: n/a    Medication Review: TICAGRELOR (BRILINTA) Ticagrelor 90 mg BID initiated on 08/09/21.  - Discussed importance of taking medication around the same time every day, - Reviewed potential DDIs with patient - Advised patient of medications to avoid (NSAIDs, aspirin maintenance doses>100 mg daily) - Educated that Tylenol (acetaminophen) will be the preferred analgesic to prevent risk of bleeding  - Emphasized importance of monitoring for signs and symptoms of bleeding (abnormal bruising, prolonged bleeding, nose bleeds, bleeding from gums, discolored urine, black tarry stools)  - Educated patient to notify doctor if shortness of breath or abnormal heartbeat occur - Advised patient to alert all providers of antiplatelet therapy prior to starting a new medication or having a procedure    Follow-up Appointments:  [facility] f/u appt confirmed? Yes Scheduled to see Cardiology on 08/19/21.   If their condition worsens, is the pt aware to call PCP or go to the Emergency Dept.? yes  Final Patient Assessment: Jeff Mccarthy states he is doing well since discharge.   He has already had a follow up visit with cardiology.  His losartan was increased from 1/2 tablet daily to 1 tablet daily at the visit. The patient states he has felt slightly dizzy a few times when he has stood up too quick.  I advised him to monitor the frequency and report to the provider if too frequent spells or if he falls.  He will follow up with provider for refills if needed.

## 2021-09-03 ENCOUNTER — Other Ambulatory Visit (HOSPITAL_COMMUNITY): Payer: Self-pay

## 2021-09-14 NOTE — Progress Notes (Signed)
Cardiology Clinic Note   Patient Name: Jeff Mccarthy Date of Encounter: 09/17/2021  Primary Care Provider:  Erven Colla, DO Primary Cardiologist:  Glenetta Hew, MD  Patient Profile    Jeff Mccarthy 55 year old male presents the clinic today for follow-up evaluation post STEMI  Past Medical History    Past Medical History:  Diagnosis Date   Diabetes mellitus without complication [facility])    Past Surgical History:  Procedure Laterality Date   COLONOSCOPY     COLONOSCOPY N/A 07/08/2018   Procedure: COLONOSCOPY;  Surgeon: Daneil Dolin, MD;  [facility];  Service: Endoscopy;  Laterality: N/A;  12:15   COLONOSCOPY N/A 07/13/2018   Procedure: COLONOSCOPY;  Surgeon: Daneil Dolin, MD;  [facility];  Service: Endoscopy;  Laterality: N/A;   CORONARY STENT INTERVENTION N/A 08/09/2021   Procedure: CORONARY STENT INTERVENTION;  Surgeon: Leonie Man, MD;  Location: [place] [facility];  Service: Cardiovascular;  Laterality: N/A;   CORONARY/GRAFT ACUTE MI REVASCULARIZATION N/A 08/09/2021   Procedure: Coronary/Graft Acute MI Revascularization;  Surgeon: Leonie Man, MD;  Location: [place] [facility];  Service: Cardiovascular;  Laterality: N/A;   LEFT HEART CATH AND CORONARY ANGIOGRAPHY N/A 08/09/2021   Procedure: LEFT HEART CATH AND CORONARY ANGIOGRAPHY;  Surgeon: Leonie Man, MD;  Location: [place] [facility];  Service: Cardiovascular;  Laterality: N/A;   NECK SURGERY     C5, C6   POLYPECTOMY     POLYPECTOMY  07/08/2018   Procedure: POLYPECTOMY;  Surgeon: Daneil Dolin, MD;  [facility];  Service: Endoscopy;;  cecal, hepatic flexure,    Allergies  Allergies  Allergen Reactions   Biaxin [Clarithromycin] Other (See Comments)    GI upset    History of Present Illness    Jeff Mccarthy has a PMH of hyperlipidemia, ischemic cardiomyopathy LVEF 30-35%, and coronary artery disease status post STEMI.  Mccarthy underwent cardiac  catheterization on 08/09/2021 which showed proximal-mid LAD 60% stenosis, 30% stenosis second diagonal, and mid LAD lesion 100% stenosis just distal to second-degree diagonal.  Mccarthy received PCI with DES x2.  Mccarthy was placed on carvedilol, aspirin, atorvastatin, Farxiga, and Brilinta.  His echocardiogram 08/11/2021 showed a slight improvement in his LVEF up to 35-40% with severe hypokinesis to akinesis of the distal inferior mid to distal inferior septal and apical walls.   Mccarthy presented the clinic 08/19/2021 for follow-up evaluation stated Mccarthy felt well and had been slowly increasing his walking.  Mccarthy did initially note some dull aching in his chest.  Mccarthy reported that Mccarthy felt dull ache during PCI.  The ache resolved.  Mccarthy reported compliance with his medications and denied side effects.  We reviewed his angiography and his echocardiogram.  Mccarthy and his wife expressed understanding.  I increased his losartan to 25 mg daily, continued his blood pressure log, gave a salty 6 diet sheet, and planned follow-up in 1 month.  We will repeat his fasting lipids and LFTs in 4 to 6 weeks.  I also planned repeat a BMP in 1 week.  His lipids were at goal and LFTs stable.  His BMP was unremarkable.  Mccarthy presents the clinic today for follow-up evaluation states Mccarthy feels well.  Mccarthy continues to be very physically active walking.  Mccarthy has been tolerating his losartan well.  Mccarthy notes only occasional brief lightheadedness.  We discussed transitioning to [facility].  Mccarthy denies weight gain and irregular heartbeats.  I  will have him continue his current diet, exercise, order BMP in 1 week, stop losartan, start Entresto, and plan follow-up for 1 month.  We reviewed his previous BMP and lipid panel.  His lipid panel shows his LDL at 59.   Today Mccarthy denies chest pain, shortness of breath, lower extremity edema, fatigue, palpitations, melena, hematuria, hemoptysis, diaphoresis, weakness, presyncope, syncope, orthopnea, and PND.  Home Medications     Prior to Admission medications   Medication Sig Start Date End Date Taking? Authorizing Provider  aspirin 81 MG EC tablet Take 1 tablet (81 mg total) by mouth daily. 08/11/21 08/11/22  Leonie Man, MD  atorvastatin (LIPITOR) 80 MG tablet Take 1 tablet (80 mg total) by mouth daily. 08/11/21   Leonie Man, MD  carvedilol (COREG) 3.125 MG tablet Take 1 tablet (3.125 mg total) by mouth 2 (two) times daily with a meal. 08/11/21   Leonie Man, MD  dapagliflozin propanediol (FARXIGA) 10 MG TABS tablet Take 1 tablet (10 mg total) by mouth daily. 08/11/21   Leonie Man, MD  losartan (COZAAR) 25 MG tablet Take 1 tablet (25 mg total) by mouth daily. 08/19/21 08/19/22  Deberah Pelton, NP  metFORMIN (GLUCOPHAGE) 500 MG tablet Take one tablet po BID with meals 08/12/21   Leonie Man, MD  metFORMIN (GLUCOPHAGE) 500 MG tablet Take 500 mg by mouth 2 (two) times daily with a meal.    [provider]  nitroGLYCERIN (NITROSTAT) 0.4 MG SL tablet Place 1 tablet (0.4 mg total) under the tongue every 5 (five) minutes as needed for chest pain. 08/11/21   Leonie Man, MD  ticagrelor (BRILINTA) 90 MG TABS tablet Take 1 tablet (90 mg total) by mouth 2 (two) times daily. 08/11/21   Leonie Man, MD    Family History    History reviewed. No pertinent family history. has no family status information on file.   Social History    Social History   Socioeconomic History   Marital status: Married    Spouse name: Not on file   Number of children: Not on file   Years of education: Not on file   Highest education level: Not on file  Occupational History   Not on file  Tobacco Use   Smoking status: Former    Types: Cigarettes    Quit date: 11/22/2014    Years since quitting: 6.8   Smokeless tobacco: Never  Vaping Use   Vaping Use: Never used  Substance and Sexual Activity   Alcohol use: Yes   Drug use: Never   Sexual activity: Not on file  Other Topics Concern   Not on file   Social History Narrative   Not on file   Social Determinants of Health   Financial Resource Strain: Not on file  Food Insecurity: Not on file  Transportation Needs: Not on file  Physical Activity: Not on file  Stress: Not on file  Social Connections: Not on file  Intimate Partner Violence: Not on file     Review of Systems    General:  No chills, fever, night sweats or weight changes.  Cardiovascular:  No chest pain, dyspnea on exertion, edema, orthopnea, palpitations, paroxysmal nocturnal dyspnea. Dermatological: No rash, lesions/masses Respiratory: No cough, dyspnea Urologic: No hematuria, dysuria Abdominal:   No nausea, vomiting, diarrhea, bright red blood per rectum, melena, or hematemesis Neurologic:  No visual changes, wkns, changes in mental status. All other systems reviewed and are otherwise negative except as  noted above.  Physical Exam    VS:  BP 106/76   Pulse 69   Ht 5\' 10"  (1.778 m)   Wt 219 lb 6.4 oz (99.5 kg)   SpO2 97%   BMI 31.48 kg/m  , BMI Body mass index is 31.48 kg/m. GEN: Well nourished, well developed, in no acute distress. HEENT: normal. Neck: Supple, no JVD, carotid bruits, or masses. Cardiac: RRR, no murmurs, rubs, or gallops. No clubbing, cyanosis, edema.  Radials/DP/PT 2+ and equal bilaterally.  Respiratory:  Respirations regular and unlabored, clear to auscultation bilaterally. GI: Soft, nontender, nondistended, BS + x 4. MS: no deformity or atrophy. Skin: warm and dry, no rash. Neuro:  Strength and sensation are intact. Psych: Normal affect.  Accessory Clinical Findings    Recent Labs: 08/11/2021: Hemoglobin 17.0; Platelets 187 08/26/2021: BUN 17; Creatinine, Ser 0.96; Potassium 4.2; Sodium 138 09/15/2021: ALT 27   Recent Lipid Panel    Component Value Date/Time   CHOL 122 09/15/2021 0951   TRIG 112 09/15/2021 0951   HDL 43 09/15/2021 0951   CHOLHDL 2.8 09/15/2021 0951   CHOLHDL 3.8 08/10/2021 0108   VLDL 33 08/10/2021 0108    LDLCALC 59 09/15/2021 0951    ECG personally reviewed by me today-none today.  EKG 08/19/2021 normal sinus rhythm low voltage QRS atrial septal infarct undetermined age 42 bpm- No acute changes   Echocardiogram 08/11/2021 IMPRESSIONS     1. Severe hypokinesis/Akinesis of the distal inferior, mid/distal  inferoseptal and apical walls. . Left ventricular ejection fraction, by  estimation, is 35 to 40%. The left ventricle has moderately decreased  function. There is severe asymmetric left  ventricular hypertrophy. Left ventricular diastolic parameters are  consistent with Grade I diastolic dysfunction (impaired relaxation).  Elevated left atrial pressure.   2. Right ventricular systolic function is normal. The right ventricular  size is normal.   3. The mitral valve is normal in structure. Trivial mitral valve  regurgitation.   4. The aortic valve is normal in structure. Aortic valve regurgitation is  not visualized.   5. There is mild dilatation of the aortic root, measuring 38 mm.   Cardiac catheterization 08/09/2021   Prox LAD to Mid LAD lesion is 60% stenosed with 30% stenosed side branch in 2nd Diag.   Mid LAD-1 lesion is 100% stenosed just distal to 2nd Diag   A drug-eluting stent was successfully placed covering the upstream 60% stenosis and the original on her present thrombotic occlusion site, using a STENT ONYX FRONTIER 3.0X18.  Taper post dilation from 4.1 (prior to the side branch to 3.3 after the side branch.   Post intervention, there is a 0% residual stenosisn of the original occlusion site and upstream lesion.  Post intervention, the 2nd Diag side branch was reduced to 30% residual stenosis.   Mid LAD-2 lesion is 70% stenosed -> originally thought to be spasm, however appeared to be persistent despite intracoronary nitroglycerin and angioplasty.   A second, overlapping drug-eluting stent was successfully placed to cover the mid LAD 70% lesions and overlapping proximally,  using a STENT ONYX FRONTIER 3.0X26.  Postdilated to 3.3 mm.   Post intervention, there is a 0% residual stenosis of the additional stented site.Marland Kitchen   ------------------------------------------------------------------------------------   Colon Flattery RCA lesion is 30% stenosed.   The remainder of the coronaries including the RCA and LCx are at least mild to moderately ectatic.   ------------------------------------------------------------------------------------   There is moderate to severe left ventricular systolic dysfunction.  The left ventricular  ejection fraction is 30-35% by visual estimate.   LV end diastolic pressure is severely elevated - 22-24 mmhg   There is mild (2+) mitral regurgitation.   SUMMARY Acute anterior ST elevation MI due to 100% occlusion of the mid LAD just after 2nd Diag branch. Successful PTCA-DES PCI of a long segment in the mid LAD using 2 overlapping Onyx Frontier DES stents: 3.0 mm x 18 mm, overlapped distally with a 3.0 mm x 26 mm for continuing disease initially felt to be spasm -> stents were postdilated to 4.1 mm proximal to the diagonal branch, and 3.  3 mm throughout the stented segment Generally large ectatic vessels with mild luminal irregularities throughout.  Very difficult to feel due to the ectasia and tortuosity of vessels, but no other significant lesions identified. 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   RECOMMENDATIONS CVICU admission.  Gentle post-cath hydration to avoid pulmonary edema given elevated LVEDP. Low threshold for diuresis Due to possible stunning of the myocardium, would recommend waiting until 08/11/2021 to check echocardiogram and assess EF.  If EF less than 35%, would consider LifeVest on discharge. Add low-dose carvedilol 3.125 mg twice daily starting tonight if pressures allow. If pressures are stable, consider ARB prior to discharge Delene Loll if indicated  by LVEF reduction) Will start Farxiga (SGLT2 inhibitor) High-dose high intensity statin Long-term DAPT       Glenetta Hew, MD   Diagnostic Dominance: Right Intervention      Assessment & Plan   1.  Ischemic cardiomyopathy-continues to increase physical activity.  Denies increased DOE.  NYHA class II Stop losartan Start Entresto 24-26 Continue carvedilol, Farxiga Repeat BMP in 1 week We will continue to increase guideline directed medical therapy and repeat echocardiogram once medication has been optimized.   Coronary artery disease-no chest pain today.  Status post STEMI, underwent cardiac catheterization on 08/09/2021 and received DES x2 to his mid LAD.  Echocardiogram showed a slight improvement in his LVEF up to 35-40%. Continue carvedilol, aspirin, Brilinta, Farxiga, atorvastatin Heart healthy low-sodium diet-salty 6 given Increase physical activity as tolerated  Hyperlipidemia-08/10/2021: VLDL 33 09/15/2021: Cholesterol, Total 122; HDL 43; LDL Chol Calc (NIH) 59; Triglycerides 112 Continue atorvastatin, aspirin Heart healthy low-sodium high-fiber diet Increase physical activity as tolerated   Type 2 diabetes-glucose 97 on 08/26/2021 Continue metformin, [place] Cancel with PCP   Disposition: Follow-up with me or Dr. Ellyn Hack in 1 month   Jossie Ng. Aadhira Heffernan NP-C    09/17/2021, 8:54 AM [place] [address] Office [phone] Fax [phone]  Notice: This dictation was prepared with Dragon dictation along with smaller phrase technology. Any transcriptional errors that result from this process are unintentional and may not be corrected upon review.  I spent 13 minutes examining this patient, reviewing medications, and using patient centered shared decision making involving her cardiac care.  Prior to her visit I spent greater than 20 minutes reviewing her past medical history,  medications, and prior cardiac tests.

## 2021-09-16 LAB — LIPID PANEL
Chol/HDL Ratio: 2.8 ratio (ref 0.0–5.0)
Cholesterol, Total: 122 mg/dL (ref 100–199)
HDL: 43 mg/dL
LDL Chol Calc (NIH): 59 mg/dL (ref 0–99)
Triglycerides: 112 mg/dL (ref 0–149)
VLDL Cholesterol Cal: 20 mg/dL (ref 5–40)

## 2021-09-16 LAB — HEPATIC FUNCTION PANEL
ALT: 27 IU/L (ref 0–44)
AST: 22 IU/L (ref 0–40)
Albumin: 5 g/dL — ABNORMAL HIGH (ref 3.8–4.9)
Alkaline Phosphatase: 61 IU/L (ref 44–121)
Bilirubin Total: 0.8 mg/dL (ref 0.0–1.2)
Bilirubin, Direct: 0.26 mg/dL (ref 0.00–0.40)
Total Protein: 7.2 g/dL (ref 6.0–8.5)

## 2021-09-17 ENCOUNTER — Other Ambulatory Visit: Payer: Self-pay

## 2021-09-17 ENCOUNTER — Encounter: Payer: Self-pay | Admitting: General Practice

## 2021-09-17 ENCOUNTER — Ambulatory Visit (INDEPENDENT_AMBULATORY_CARE_PROVIDER_SITE_OTHER): Payer: No Typology Code available for payment source | Admitting: General Practice

## 2021-09-17 VITALS — BP 106/76 | HR 69 | Ht 70.0 in | Wt 219.4 lb

## 2021-09-17 DIAGNOSIS — I255 Ischemic cardiomyopathy: Secondary | ICD-10-CM | POA: Diagnosis not present

## 2021-09-17 DIAGNOSIS — E785 Hyperlipidemia, unspecified: Secondary | ICD-10-CM | POA: Diagnosis not present

## 2021-09-17 DIAGNOSIS — I2510 Atherosclerotic heart disease of native coronary artery without angina pectoris: Secondary | ICD-10-CM | POA: Diagnosis not present

## 2021-09-17 DIAGNOSIS — E118 Type 2 diabetes mellitus with unspecified complications: Secondary | ICD-10-CM

## 2021-09-17 DIAGNOSIS — Z79899 Other long term (current) drug therapy: Secondary | ICD-10-CM

## 2021-09-17 MED ORDER — ENTRESTO 24-26 MG PO TABS: 1.0000 | ORAL_TABLET | Freq: Two times a day (BID) | ORAL | 3 refills | Status: DC

## 2021-09-17 NOTE — Patient Instructions (Addendum)
Medication Instructions:  STOP LOSARTAN  START SACUBITRIL-VALSARTAN 24-26MG  TWICE DAILY  *If you need a refill on your cardiac medications before your next appointment, please call your pharmacy*  Lab Work: BMET-IN 1 WEEK THIS IS NOT FASTING If you have labs (blood work) drawn today and your tests are completely normal, you will receive your results only by:  [place] (if you have MyChart) OR A paper copy in the mail.  If you have any lab test that is abnormal or we need to change your treatment, we will call you to review the results. You may go to any [facility] that is convenient for you however, we do have a lab in our office that is able to assist you. You DO NOT need an appointment for our lab. The lab is open 8:00am and closes at 4:00pm. Lunch 12:45 - 1:45pm.  Special Instructions TAKE AND LOG YOUR BLOOD PRESSURE-FOLLOW DIRECTIONS ON LOG  CONTINUE DIET AND EXERCISE  Follow-Up: Your next appointment:  1 month(s) In Person with Glenetta Hew, MD OR IF UNAVAILABLE [place], FNP-C     At [facility], you and your health needs are our priority.  As part of our continuing mission to provide you with exceptional heart care, we have created designated Provider Care Teams.  These Care Teams include your primary Cardiologist (physician) and Advanced Practice Providers (APPs -  Physician Assistants and Nurse Practitioners) who all work together to provide you with the care you need, when you need it.

## 2021-09-27 LAB — BASIC METABOLIC PANEL
BUN/Creatinine Ratio: 22 — ABNORMAL HIGH (ref 9–20)
BUN: 19 mg/dL (ref 6–24)
CO2: 24 mmol/L (ref 20–29)
Calcium: 10 mg/dL (ref 8.7–10.2)
Chloride: 98 mmol/L (ref 96–106)
Creatinine, Ser: 0.87 mg/dL (ref 0.76–1.27)
Glucose: 102 mg/dL — ABNORMAL HIGH (ref 70–99)
Potassium: 4.1 mmol/L (ref 3.5–5.2)
Sodium: 140 mmol/L (ref 134–144)
eGFR: 103 mL/min/{1.73_m2} (ref >59)

## 2021-10-01 ENCOUNTER — Telehealth: Payer: Self-pay | Admitting: Cardiology

## 2021-10-01 NOTE — Telephone Encounter (Signed)
Spoke with patient regarding the following results. Patient made aware and patient verbalized understanding.   Deberah Pelton, NP  09/16/2021  6:14 AM EDT     Please contact Mr. Gauger and let him know that his lipid panel and liver panel have been reviewed.  His cholesterol is now at goal.  His liver panel remains normal for him.  No further testing is needed at this time.  We will continue his current medication regimen.  Thank you.    Deberah Pelton, NP  09/28/2021 12:40 PM EST     Please contact Mr. Caraher and let him know that his lab work has been reviewed.  His creatinine and electrolyte panel are normal for him.  No further testing is needed at this time.  We will continue with his current medication regimen.  Thank you.   Advised patient to call back to office with any issues, questions, or concerns. Patient verbalized understanding.

## 2021-10-01 NOTE — Telephone Encounter (Signed)
noted 

## 2021-10-01 NOTE — Telephone Encounter (Signed)
Patient is returning call to discuss lab results. 

## 2021-10-12 NOTE — Progress Notes (Signed)
Cardiology Clinic Note   Patient Name: Jeff Mccarthy Date of Encounter: 10/13/2021  Primary Care Provider:  Erven Colla, DO Primary Cardiologist:  Glenetta Hew, MD  Patient Profile    Jeff Mccarthy 55 year old male presents the clinic today for follow-up evaluation post STEMI  Past Medical History    Past Medical History:  Diagnosis Date   Diabetes mellitus without complication [facility])    Past Surgical History:  Procedure Laterality Date   COLONOSCOPY     COLONOSCOPY N/A 07/08/2018   Procedure: COLONOSCOPY;  Surgeon: Daneil Dolin, MD;  [facility];  Service: Endoscopy;  Laterality: N/A;  12:15   COLONOSCOPY N/A 07/13/2018   Procedure: COLONOSCOPY;  Surgeon: Daneil Dolin, MD;  [facility];  Service: Endoscopy;  Laterality: N/A;   CORONARY STENT INTERVENTION N/A 08/09/2021   Procedure: CORONARY STENT INTERVENTION;  Surgeon: Leonie Man, MD;  Location: [place] [facility] LAB;  Service: Cardiovascular;  Laterality: N/A;   CORONARY/GRAFT ACUTE MI REVASCULARIZATION N/A 08/09/2021   Procedure: Coronary/Graft Acute MI Revascularization;  Surgeon: Leonie Man, MD;  Location: [place] [facility] LAB;  Service: Cardiovascular;  Laterality: N/A;   LEFT HEART CATH AND CORONARY ANGIOGRAPHY N/A 08/09/2021   Procedure: LEFT HEART CATH AND CORONARY ANGIOGRAPHY;  Surgeon: Leonie Man, MD;  Location: [place] CV LAB;  Service: Cardiovascular;  Laterality: N/A;   NECK SURGERY     C5, C6   POLYPECTOMY     POLYPECTOMY  07/08/2018   Procedure: POLYPECTOMY;  Surgeon: Daneil Dolin, MD;  [facility];  Service: Endoscopy;;  cecal, hepatic flexure,    Allergies  Allergies  Allergen Reactions   Biaxin [Clarithromycin] Other (See Comments)    GI upset    History of Present Illness    Jeff Mccarthy he has a PMH of hyperlipidemia, ischemic cardiomyopathy LVEF 30-35%, and coronary artery disease status post STEMI.  He underwent cardiac  catheterization on 08/09/2021 which showed proximal-mid LAD 60% stenosis, 30% stenosis second diagonal, and mid LAD lesion 100% stenosis just distal to second-degree diagonal.  He received PCI with DES x2.  He was placed on carvedilol, aspirin, atorvastatin, Farxiga, and Brilinta.  His echocardiogram 08/11/2021 showed a slight improvement in his LVEF up to 35-40% with severe hypokinesis to akinesis of the distal inferior mid to distal inferior septal and apical walls.   He presented the clinic 08/19/2021 for follow-up evaluation stated he felt well and had been slowly increasing his walking.  He did initially note some dull aching in his chest.  He reported that he felt dull ache during PCI.  The ache resolved.  He reported compliance with his medications and denied side effects.  We reviewed his angiography and his echocardiogram.  He and his wife expressed understanding.  I increased his losartan to 25 mg daily, continued his blood pressure log, gave a salty 6 diet sheet, and planned follow-up in 1 month.  We will repeat his fasting lipids and LFTs in 4 to 6 weeks.  I also planned repeat a BMP in 1 week.  His lipids were at goal and LFTs stable.  His BMP was unremarkable.  He presented to the clinic 09/17/2021 for follow-up evaluation stated he felt well.  He continued to be very physically active walking.  He had been tolerating his losartan well.  He noted only occasional brief lightheadedness.  We discussed transitioning to [facility].  He denied weight gain and irregular heartbeats.  I asked him to continue his current diet, exercise, ordered BMP in 1 week, stopped losartan, started Entresto, and planned follow-up for 1 month.  We reviewed his previous BMP and lipid panel.  His lipid panel shows his LDL at 59.  He presents to the clinic today for follow-up evaluation states he feels well.  We reviewed his heart failure and EF.  At home his blood pressure has been ranging 26-712 systolic range over 45Y  diastolic.  He denies lightheadedness, dizziness, presyncope and syncope.  He continues to be very physically active walking 2 miles per day.  At this time we are unable to uptitrate his Entresto.  He has been on his current dose for 1 month and we will schedule echocardiogram.  He reports that he will be out of town some towards the end of the month.  We will schedule follow-up for 3 to 4 months.   Today he denies chest pain, shortness of breath, lower extremity edema, fatigue, palpitations, melena, hematuria, hemoptysis, diaphoresis, weakness, presyncope, syncope, orthopnea, and PND.  Home Medications    Prior to Admission medications   Medication Sig Start Date End Date Taking? Authorizing Provider  aspirin 81 MG EC tablet Take 1 tablet (81 mg total) by mouth daily. 08/11/21 08/11/22  Leonie Man, MD  atorvastatin (LIPITOR) 80 MG tablet Take 1 tablet (80 mg total) by mouth daily. 08/11/21   Leonie Man, MD  carvedilol (COREG) 3.125 MG tablet Take 1 tablet (3.125 mg total) by mouth 2 (two) times daily with a meal. 08/11/21   Leonie Man, MD  dapagliflozin propanediol (FARXIGA) 10 MG TABS tablet Take 1 tablet (10 mg total) by mouth daily. 08/11/21   Leonie Man, MD  losartan (COZAAR) 25 MG tablet Take 1 tablet (25 mg total) by mouth daily. 08/19/21 08/19/22  Deberah Pelton, NP  metFORMIN (GLUCOPHAGE) 500 MG tablet Take one tablet po BID with meals 08/12/21   Leonie Man, MD  metFORMIN (GLUCOPHAGE) 500 MG tablet Take 500 mg by mouth 2 (two) times daily with a meal.    [provider]  nitroGLYCERIN (NITROSTAT) 0.4 MG SL tablet Place 1 tablet (0.4 mg total) under the tongue every 5 (five) minutes as needed for chest pain. 08/11/21   Leonie Man, MD  ticagrelor (BRILINTA) 90 MG TABS tablet Take 1 tablet (90 mg total) by mouth 2 (two) times daily. 08/11/21   Leonie Man, MD    Family History    No family history on file. has no family status information on file.    Social History    Social History   Socioeconomic History   Marital status: Married    Spouse name: Not on file   Number of children: Not on file   Years of education: Not on file   Highest education level: Not on file  Occupational History   Not on file  Tobacco Use   Smoking status: Former    Types: Cigarettes    Quit date: 11/22/2014    Years since quitting: 6.8   Smokeless tobacco: Never  Vaping Use   Vaping Use: Never used  Substance and Sexual Activity   Alcohol use: Yes   Drug use: Never   Sexual activity: Not on file  Other Topics Concern   Not on file  Social History Narrative   Not on file   Social Determinants of Health   Financial Resource Strain: Not on file  Food Insecurity: Not on file  Transportation Needs: Not on file  Physical Activity: Not on file  Stress: Not on file  Social Connections: Not on file  Intimate Partner Violence: Not on file     Review of Systems    General:  No chills, fever, night sweats or weight changes.  Cardiovascular:  No chest pain, dyspnea on exertion, edema, orthopnea, palpitations, paroxysmal nocturnal dyspnea. Dermatological: No rash, lesions/masses Respiratory: No cough, dyspnea Urologic: No hematuria, dysuria Abdominal:   No nausea, vomiting, diarrhea, bright red blood per rectum, melena, or hematemesis Neurologic:  No visual changes, wkns, changes in mental status. All other systems reviewed and are otherwise negative except as noted above.  Physical Exam    VS:  BP 110/72   Pulse 71   Ht 5\' 10"  (1.778 m)   Wt 213 lb (96.6 kg)   BMI 30.56 kg/m  , BMI Body mass index is 30.56 kg/m. GEN: Well nourished, well developed, in no acute distress. HEENT: normal. Neck: Supple, no JVD, carotid bruits, or masses. Cardiac: RRR, no murmurs, rubs, or gallops. No clubbing, cyanosis, edema.  Radials/DP/PT 2+ and equal bilaterally.  Respiratory:  Respirations regular and unlabored, clear to auscultation  bilaterally. GI: Soft, nontender, nondistended, BS + x 4. MS: no deformity or atrophy. Skin: warm and dry, no rash. Neuro:  Strength and sensation are intact. Psych: Normal affect.  Accessory Clinical Findings    Recent Labs: 08/11/2021: Hemoglobin 17.0; Platelets 187 09/15/2021: ALT 27 09/26/2021: BUN 19; Creatinine, Ser 0.87; Potassium 4.1; Sodium 140   Recent Lipid Panel    Component Value Date/Time   CHOL 122 09/15/2021 0951   TRIG 112 09/15/2021 0951   HDL 43 09/15/2021 0951   CHOLHDL 2.8 09/15/2021 0951   CHOLHDL 3.8 08/10/2021 0108   VLDL 33 08/10/2021 0108   LDLCALC 59 09/15/2021 0951    ECG personally reviewed by me today-none today.  EKG 08/19/2021 normal sinus rhythm low voltage QRS atrial septal infarct undetermined age 5 bpm- No acute changes   Echocardiogram 08/11/2021 IMPRESSIONS     1. Severe hypokinesis/Akinesis of the distal inferior, mid/distal  inferoseptal and apical walls. . Left ventricular ejection fraction, by  estimation, is 35 to 40%. The left ventricle has moderately decreased  function. There is severe asymmetric left  ventricular hypertrophy. Left ventricular diastolic parameters are  consistent with Grade I diastolic dysfunction (impaired relaxation).  Elevated left atrial pressure.   2. Right ventricular systolic function is normal. The right ventricular  size is normal.   3. The mitral valve is normal in structure. Trivial mitral valve  regurgitation.   4. The aortic valve is normal in structure. Aortic valve regurgitation is  not visualized.   5. There is mild dilatation of the aortic root, measuring 38 mm.   Cardiac catheterization 08/09/2021   Prox LAD to Mid LAD lesion is 60% stenosed with 30% stenosed side branch in 2nd Diag.   Mid LAD-1 lesion is 100% stenosed just distal to 2nd Diag   A drug-eluting stent was successfully placed covering the upstream 60% stenosis and the original on her present thrombotic occlusion site, using a  STENT ONYX FRONTIER 3.0X18.  Taper post dilation from 4.1 (prior to the side branch to 3.3 after the side branch.   Post intervention, there is a 0% residual stenosisn of the original occlusion site and upstream lesion.  Post intervention, the 2nd Diag side branch was reduced to 30% residual stenosis.   Mid LAD-2 lesion is 70% stenosed -> originally thought to be  spasm, however appeared to be persistent despite intracoronary nitroglycerin and angioplasty.   A second, overlapping drug-eluting stent was successfully placed to cover the mid LAD 70% lesions and overlapping proximally, using a STENT ONYX FRONTIER 3.0X26.  Postdilated to 3.3 mm.   Post intervention, there is a 0% residual stenosis of the additional stented site.Marland Kitchen   ------------------------------------------------------------------------------------   Colon Flattery RCA lesion is 30% stenosed.   The remainder of the coronaries including the RCA and LCx are at least mild to moderately ectatic.   ------------------------------------------------------------------------------------   There is moderate to severe left ventricular systolic dysfunction.  The left ventricular ejection fraction is 30-35% by visual estimate.   LV end diastolic pressure is severely elevated - 22-24 mmhg   There is mild (2+) mitral regurgitation.   SUMMARY Acute anterior ST elevation MI due to 100% occlusion of the mid LAD just after 2nd Diag branch. Successful PTCA-DES PCI of a long segment in the mid LAD using 2 overlapping Onyx Frontier DES stents: 3.0 mm x 18 mm, overlapped distally with a 3.0 mm x 26 mm for continuing disease initially felt to be spasm -> stents were postdilated to 4.1 mm proximal to the diagonal branch, and 3.  3 mm throughout the stented segment Generally large ectatic vessels with mild luminal irregularities throughout.  Very difficult to feel due to the ectasia and tortuosity of vessels, but no other significant lesions identified. Acute Combined  Systolic and Diastolic Heart Failure with EF ~35% with anterior hypokinesis, and elevated LVEDP of 23 mmHg.   RECOMMENDATIONS CVICU admission.  Gentle post-cath hydration to avoid pulmonary edema given elevated LVEDP. Low threshold for diuresis Due to possible stunning of the myocardium, would recommend waiting until 08/11/2021 to check echocardiogram and assess EF.  If EF less than 35%, would consider LifeVest on discharge. Add low-dose carvedilol 3.125 mg twice daily starting tonight if pressures allow. If pressures are stable, consider ARB prior to discharge Delene Loll if indicated by LVEF reduction) Will start Farxiga (SGLT2 inhibitor) High-dose high intensity statin Long-term DAPT       Glenetta Hew, MD   Diagnostic Dominance: Right Intervention      Assessment & Plan   1.  Ischemic cardiomyopathy-continues to increase physical activity.  Denies increased DOE.  NYHA class II. Home BP Cont Entresto 24-26 Continue carvedilol, Farxiga Order ECHO Continue physical activity  Coronary artery disease-no chest pain .  Post STEMI, underwent cardiac catheterization on 08/09/2021 and received DES x2 to his mid LAD.  Echocardiogram showed a slight improvement in his LVEF up to 35-40%. Continue carvedilol, aspirin, Brilinta, Farxiga, atorvastatin Heart healthy low-sodium diet-salty 6 given Increase physical activity as tolerated  Hyperlipidemia-08/10/2021: VLDL 33 09/15/2021: Cholesterol, Total 122; HDL 43; LDL Chol Calc (NIH) 59; Triglycerides 112 Continue atorvastatin, aspirin Heart healthy low-sodium high-fiber diet Increase physical activity as tolerated     Disposition: Follow-up with me or Dr. Ellyn Hack or me in 3-4 month   Jossie Ng. Kingston Shawgo NP-C    10/13/2021, 4:07 PM [facility] [address] Office [phone] Fax [phone]  Notice: This dictation was prepared with Dragon dictation along with smaller phrase technology. Any  transcriptional errors that result from this process are unintentional and may not be corrected upon review.  I spent 13 minutes examining this patient, reviewing medications, and using patient centered shared decision making involving her cardiac care.  Prior to her visit I spent greater than 20 minutes reviewing her past medical history,  medications, and prior  cardiac tests.

## 2021-10-13 ENCOUNTER — Encounter: Payer: Self-pay | Admitting: General Practice

## 2021-10-13 ENCOUNTER — Ambulatory Visit (INDEPENDENT_AMBULATORY_CARE_PROVIDER_SITE_OTHER): Payer: No Typology Code available for payment source | Admitting: General Practice

## 2021-10-13 ENCOUNTER — Other Ambulatory Visit: Payer: Self-pay

## 2021-10-13 VITALS — BP 110/72 | HR 71 | Ht 70.0 in | Wt 213.0 lb

## 2021-10-13 DIAGNOSIS — I2510 Atherosclerotic heart disease of native coronary artery without angina pectoris: Secondary | ICD-10-CM | POA: Diagnosis not present

## 2021-10-13 DIAGNOSIS — E785 Hyperlipidemia, unspecified: Secondary | ICD-10-CM | POA: Diagnosis not present

## 2021-10-13 DIAGNOSIS — I5042 Chronic combined systolic (congestive) and diastolic (congestive) heart failure: Secondary | ICD-10-CM | POA: Diagnosis not present

## 2021-10-13 DIAGNOSIS — I255 Ischemic cardiomyopathy: Secondary | ICD-10-CM

## 2021-10-13 NOTE — Patient Instructions (Signed)
Medication Instructions:  Your Physician recommend you continue on your current medication as directed.    *If you need a refill on your cardiac medications before your next appointment, please call your pharmacy*   Lab Work: None.   Testing/Procedures: Your physician has requested that you have an echocardiogram. Echocardiography is a painless test that uses sound waves to create images of your heart. It provides your doctor with information about the size and shape of your heart and how well your heart's chambers and valves are working. This procedure takes approximately one hour. There are no restrictions for this procedure.   Follow-Up: At [facility], you and your health needs are our priority.  As part of our continuing mission to provide you with exceptional heart care, we have created designated Provider Care Teams.  These Care Teams include your primary Cardiologist (physician) and Advanced Practice Providers (APPs -  Physician Assistants and Nurse Practitioners) who all work together to provide you with the care you need, when you need it.  We recommend signing up for the patient portal called "MyChart".  Sign up information is provided on this After Visit Summary.  MyChart is used to connect with patients for Virtual Visits (Telemedicine).  Patients are able to view lab/test results, encounter notes, upcoming appointments, etc.  Non-urgent messages can be sent to your provider as well.   To learn more about what you can do with MyChart, go to [email or web address].    Your next appointment:   3-4 month(s)  The format for your next appointment:   In Person  Provider:   Dr. Ellyn Hack

## 2021-11-03 ENCOUNTER — Other Ambulatory Visit: Payer: Self-pay

## 2021-11-03 ENCOUNTER — Ambulatory Visit (HOSPITAL_COMMUNITY): Payer: No Typology Code available for payment source | Attending: General Practice

## 2021-11-03 DIAGNOSIS — I5042 Chronic combined systolic (congestive) and diastolic (congestive) heart failure: Secondary | ICD-10-CM | POA: Insufficient documentation

## 2021-11-03 LAB — ECHOCARDIOGRAM COMPLETE
Area-P 1/2: 2.87 cm2
S' Lateral: 2.9 cm

## 2021-11-03 MED ORDER — PERFLUTREN LIPID MICROSPHERE
1.0000 mL | INTRAVENOUS | Status: AC | PRN
  Administered 2021-11-03: 2 mL via INTRAVENOUS

## 2021-12-31 ENCOUNTER — Other Ambulatory Visit: Payer: Self-pay | Admitting: Family Medicine

## 2021-12-31 ENCOUNTER — Other Ambulatory Visit: Payer: Self-pay | Admitting: General Practice

## 2021-12-31 NOTE — Telephone Encounter (Signed)
Please contact patient to have him schedule appt. Thank you!!

## 2022-01-01 NOTE — Telephone Encounter (Signed)
Sent my chart message 01/01/22

## 2022-01-02 ENCOUNTER — Other Ambulatory Visit: Payer: Self-pay | Admitting: *Deleted

## 2022-01-02 MED ORDER — METFORMIN HCL 500 MG PO TABS: 500.0000 mg | ORAL_TABLET | Freq: Two times a day (BID) | ORAL | 0 refills | Status: DC

## 2022-01-06 NOTE — Telephone Encounter (Signed)
Appointment on 01/14/22

## 2022-01-13 ENCOUNTER — Telehealth: Payer: Self-pay | Admitting: Cardiology

## 2022-01-13 NOTE — Telephone Encounter (Signed)
° °  Pre-operative Risk Assessment    Patient Name: Jeff Mccarthy  DOB: 12/31/1965 MRN: [id number]     Request for Surgical Clearance    Procedure:  Dental Extraction - Amount of Teeth to be Pulled:  All Teeth Cleaned  Date of Surgery:  01-13-22- nowClearance                                  Surgeon: Dr Thurnell Lose:   Phone number:  [phone] Fax number:  [phone]   Type of Clearance Requested:  Does patinet need to be pre medicated before dental work    What type of anesthesia will be used?  Press F2 and select the anesthesia to be used for the procedure.  :1}  Type of Anesthesia:       Additional requests/questions:    Signed, Glyn Ade   01/13/2022, 1:17 PM

## 2022-01-13 NOTE — Telephone Encounter (Signed)
° °  Patient Name: Jeff Mccarthy  DOB: 12/31/1965 MRN: [id number]  Primary Cardiologist: Glenetta Hew, MD  Chart reviewed as part of pre-operative protocol coverage.   We were called urgently to assess whether premedication is needed for this patient for his dental cleaning. (No extractions planned, just cleaning.) SBE prophylaxis is not required for the patient from a cardiac standpoint. It is also generally accepted that for simple extractions and dental cleanings, there is no need to interrupt blood thinner therapy (would not stop ASA/Brilinta given patient's MI 07/2021). I verbally relayed this recommendation to dental office.  I will route this recommendation to the requesting party via Epic fax function and remove from pre-op pool.  Please call with questions.  Charlie Pitter, PA-C 01/13/2022, 1:31 PM

## 2022-01-14 ENCOUNTER — Ambulatory Visit (INDEPENDENT_AMBULATORY_CARE_PROVIDER_SITE_OTHER): Payer: PRIVATE HEALTH INSURANCE | Admitting: Family Medicine

## 2022-01-14 ENCOUNTER — Other Ambulatory Visit: Payer: Self-pay

## 2022-01-14 VITALS — BP 112/70 | HR 103 | Temp 97.0°F | Ht 70.0 in | Wt 213.0 lb

## 2022-01-14 DIAGNOSIS — E785 Hyperlipidemia, unspecified: Secondary | ICD-10-CM

## 2022-01-14 DIAGNOSIS — E1169 Type 2 diabetes mellitus with other specified complication: Secondary | ICD-10-CM | POA: Diagnosis not present

## 2022-01-14 DIAGNOSIS — I2510 Atherosclerotic heart disease of native coronary artery without angina pectoris: Secondary | ICD-10-CM | POA: Diagnosis not present

## 2022-01-14 DIAGNOSIS — E118 Type 2 diabetes mellitus with unspecified complications: Secondary | ICD-10-CM

## 2022-01-14 DIAGNOSIS — I255 Ischemic cardiomyopathy: Secondary | ICD-10-CM | POA: Insufficient documentation

## 2022-01-14 DIAGNOSIS — I5042 Chronic combined systolic (congestive) and diastolic (congestive) heart failure: Secondary | ICD-10-CM | POA: Insufficient documentation

## 2022-01-14 MED ORDER — METFORMIN HCL 500 MG PO TABS: 500.0000 mg | ORAL_TABLET | Freq: Two times a day (BID) | ORAL | 3 refills | Status: DC

## 2022-01-14 NOTE — Assessment & Plan Note (Signed)
Ejection fraction now back to normal.  Continue current medications.  Continue close cardiology follow-up.

## 2022-01-14 NOTE — Assessment & Plan Note (Signed)
At goal.  Continue Lipitor 80 mg daily.

## 2022-01-14 NOTE — Assessment & Plan Note (Signed)
Doing well.  Asymptomatic.

## 2022-01-14 NOTE — Assessment & Plan Note (Signed)
A1c today.  Continue metformin and Farxiga. 

## 2022-01-14 NOTE — Progress Notes (Signed)
Subjective:  Patient ID: Jeff Mccarthy, male    DOB: 1966/09/03  Age: 56 y.o. MRN: [id number]  CC: Chief Complaint  Patient presents with   Diabetes    Sees cardiology     HPI:  56 year old male with CAD status post STEMI with subsequent PCI (September 2022), ischemic cardiomyopathy/systolic and diastolic CHF, type 2 diabetes, hyperlipidemia presents for follow-up.  Patient states that he is doing well.  He is feeling well.  No chest pain or shortness of breath.  Echocardiogram done in December revealed normal EF.  Grade 1 diastolic dysfunction.  He remains on aspirin, Brilinta, Entresto, Farxiga, carvedilol.  Doing well.  No concerns.  Diabetes has been well controlled.  Most recent A1c was 6.4.  Needs repeat A1c today.  Also needs foot exam.  Patient states that it has been a few years since he saw an ophthalmologist.  Advised to get annual eye exam.  Hyperlipidemia has been well controlled.  LDL at goal on atorvastatin 80 mg daily.  Patient Active Problem List   Diagnosis Date Noted   CAD (coronary artery disease) 01/14/2022   Ischemic cardiomyopathy 01/14/2022   Chronic combined systolic and diastolic heart failure ([place]) 01/14/2022   Hyperlipidemia associated with type 2 diabetes mellitus ([place]) 08/09/2021   Type 2 diabetes mellitus with complication, without long-term current use of insulin ([place]) 04/20/2015   Obesity 12/02/2014    Social Hx   Social History   Socioeconomic History   Marital status: Married    Spouse name: Not on file   Number of children: Not on file   Years of education: Not on file   Highest education level: Not on file  Occupational History   Not on file  Tobacco Use   Smoking status: Former    Types: Cigarettes    Quit date: 11/22/2014    Years since quitting: 7.1   Smokeless tobacco: Never  Vaping Use   Vaping Use: Never used  Substance and Sexual Activity   Alcohol use: Yes   Drug use: Never   Sexual activity: Not on file  Other  Topics Concern   Not on file  Social History Narrative   Not on file   Social Determinants of Health   Financial Resource Strain: Not on file  Food Insecurity: Not on file  Transportation Needs: Not on file  Physical Activity: Not on file  Stress: Not on file  Social Connections: Not on file    Review of Systems Per HPI  Objective:  BP 112/70    Pulse (!) 103    Temp (!) 97 F (36.1 C)    Ht 5\' 10"  (1.778 m)    Wt 213 lb (96.6 kg)    SpO2 97%    BMI 30.56 kg/m   BP/Weight 01/14/2022 10/13/2021 43/15/4008  Systolic BP 676 195 093  Diastolic BP 70 72 76  Wt. (Lbs) 213 213 219.4  BMI 30.56 30.56 31.48    Physical Exam Vitals and nursing note reviewed.  Constitutional:      General: He is not in acute distress.    Appearance: Normal appearance. He is not ill-appearing.  HENT:     Head: Normocephalic and atraumatic.  Eyes:     General:        Right eye: No discharge.        Left eye: No discharge.     Conjunctiva/sclera: Conjunctivae normal.  Cardiovascular:     Rate and Rhythm: Normal rate and regular rhythm.  Pulmonary:  Effort: Pulmonary effort is normal.     Breath sounds: Normal breath sounds. No wheezing, rhonchi or rales.  Neurological:     Mental Status: He is alert.  Psychiatric:        Mood and Affect: Mood normal.        Behavior: Behavior normal.    Lab Results  Component Value Date   WBC 9.8 08/11/2021   HGB 17.0 08/11/2021   HCT 48.7 08/11/2021   PLT 187 08/11/2021   GLUCOSE 102 (H) 09/26/2021   CHOL 122 09/15/2021   TRIG 112 09/15/2021   HDL 43 09/15/2021   LDLCALC 59 09/15/2021   ALT 27 09/15/2021   AST 22 09/15/2021   NA 140 09/26/2021   K 4.1 09/26/2021   CL 98 09/26/2021   CREATININE 0.87 09/26/2021   BUN 19 09/26/2021   CO2 24 09/26/2021   HGBA1C 6.4 (H) 08/10/2021     Assessment & Plan:   Problem List Items Addressed This Visit       Cardiovascular and Mediastinum   CAD (coronary artery disease)    Doing well.   Asymptomatic.      Ischemic cardiomyopathy   Chronic combined systolic and diastolic heart failure (H[facility])    Ejection fraction now back to normal.  Continue current medications.  Continue close cardiology follow-up.        Endocrine   Type 2 diabetes mellitus with complication, without long-term current use of insulin (H[facility]) - Primary (Chronic)    A1c today.  Continue metformin and Farxiga.      Relevant Medications   metFORMIN (GLUCOPHAGE) 500 MG tablet   Other Relevant Orders   Hemoglobin A1c   Hyperlipidemia associated with type 2 diabetes mellitus (HCC) (Chronic)    At goal.  Continue Lipitor 80 mg daily.      Relevant Medications   metFORMIN (GLUCOPHAGE) 500 MG tablet    Meds ordered this encounter  Medications   metFORMIN (GLUCOPHAGE) 500 MG tablet    Sig: Take 1 tablet (500 mg total) by mouth 2 (two) times daily with a meal.    Dispense:  180 tablet    Refill:  3    Follow-up:  Return in about 6 months (around 07/14/2022).  [place]

## 2022-01-14 NOTE — Patient Instructions (Signed)
Continue your meds.  A1C today.  Follow up in 6 months.  Take care  Dr. Lacinda Axon

## 2022-01-15 LAB — HEMOGLOBIN A1C
Est. average glucose Bld gHb Est-mCnc: 131 mg/dL
Hgb A1c MFr Bld: 6.2 % — ABNORMAL HIGH (ref 4.8–5.6)

## 2022-01-28 NOTE — Progress Notes (Signed)
Cardiology Clinic Note   Patient Name: Jeff Mccarthy Date of Encounter: 02/02/2022  Primary Care Provider:  Coral Spikes, DO Primary Cardiologist:  Glenetta Hew, MD  Patient Profile    Jeff Mccarthy 56 year old male presents the clinic today for follow-up evaluation of his coronary artery disease.  Past Medical History    Past Medical History:  Diagnosis Date   Diabetes mellitus without complication [facility])    Past Surgical History:  Procedure Laterality Date   COLONOSCOPY     COLONOSCOPY N/A 07/08/2018   Procedure: COLONOSCOPY;  Surgeon: Daneil Dolin, MD;  [facility];  Service: Endoscopy;  Laterality: N/A;  12:15   COLONOSCOPY N/A 07/13/2018   Procedure: COLONOSCOPY;  Surgeon: Daneil Dolin, MD;  [facility];  Service: Endoscopy;  Laterality: N/A;   CORONARY STENT INTERVENTION N/A 08/09/2021   Procedure: CORONARY STENT INTERVENTION;  Surgeon: Leonie Man, MD;  Location: [place] [facility];  Service: Cardiovascular;  Laterality: N/A;   CORONARY/GRAFT ACUTE MI REVASCULARIZATION N/A 08/09/2021   Procedure: Coronary/Graft Acute MI Revascularization;  Surgeon: Leonie Man, MD;  Location: [place] [facility];  Service: Cardiovascular;  Laterality: N/A;   LEFT HEART CATH AND CORONARY ANGIOGRAPHY N/A 08/09/2021   Procedure: LEFT HEART CATH AND CORONARY ANGIOGRAPHY;  Surgeon: Leonie Man, MD;  Location: [place] [facility] LAB;  Service: Cardiovascular;  Laterality: N/A;   NECK SURGERY     C5, C6   POLYPECTOMY     POLYPECTOMY  07/08/2018   Procedure: POLYPECTOMY;  Surgeon: Daneil Dolin, MD;  [facility];  Service: Endoscopy;;  cecal, hepatic flexure,    Allergies  Allergies  Allergen Reactions   Biaxin [Clarithromycin] Other (See Comments)    GI upset    History of Present Illness    Jeff Mccarthy he has a PMH of hyperlipidemia, ischemic cardiomyopathy LVEF 30-35%, and coronary artery disease status post STEMI.  He underwent  cardiac catheterization on 08/09/2021 which showed proximal-mid LAD 60% stenosis, 30% stenosis second diagonal, and mid LAD lesion 100% stenosis just distal to second-degree diagonal.  He received PCI with DES x2.  He was placed on carvedilol, aspirin, atorvastatin, Farxiga, and Brilinta.  His echocardiogram 08/11/2021 showed a slight improvement in his LVEF up to 35-40% with severe hypokinesis to akinesis of the distal inferior mid to distal inferior septal and apical walls.   He presented the clinic 08/19/2021 for follow-up evaluation stated he felt well and had been slowly increasing his walking.  He did initially note some dull aching in his chest.  He reported that he felt dull ache during PCI.  The ache resolved.  He reported compliance with his medications and denied side effects.  We reviewed his angiography and his echocardiogram.  He and his wife expressed understanding.  I increased his losartan to 25 mg daily, continued his blood pressure log, gave a salty 6 diet sheet, and planned follow-up in 1 month.  We will repeat his fasting lipids and LFTs in 4 to 6 weeks.  I also planned repeat a BMP in 1 week.  His lipids were at goal and LFTs stable.  His BMP was unremarkable.  He presented to the clinic 09/17/2021 for follow-up evaluation stated he felt well.  He continued to be very physically active walking.  He had been tolerating his losartan well.  He noted only occasional brief lightheadedness.  We discussed transitioning to [facility].  He denied weight gain and  irregular heartbeats.  I asked him to continue his current diet, exercise, ordered BMP in 1 week, stopped losartan, started Entresto, and planned follow-up for 1 month.  We reviewed his previous BMP and lipid panel.  His lipid panel shows his LDL at 59.  He presented to the clinic 10/13/21 for follow-up evaluation stated he felt well.  We reviewed his heart failure and EF.  At home his blood pressure had been ranging 00-174 systolic range over  94W diastolic.  He denied lightheadedness, dizziness, presyncope and syncope.  He continued to be very physically active walking 2 miles per day.  At the time we were unable to uptitrate his Entresto.  He had been on his current dose for 1 month and we  scheduled echocardiogram.  He reported that he would be out of town some towards the end of the month.  We planned follow-up for 3 to 4 months.  His echocardiogram 11/03/2021 showed an EF of 55-60%, G1 DD trivial mitral valve regurgitation, and EF improvement from 35-40%.  He presents to the clinic today for follow-up evaluation states he feels well.  He remains physically active walking 2 miles 3-4 times a week.  He is pleased with how he is done since having his catheterization and PCI.  He reports he continues to try to follow a heart healthy low-sodium diet.  His weight remains stable.  He does notice some mild bruising on his forearms and ankles from regular physical activity.  He plans to go white water rafting in the fall on the [place].  He denies bleeding issues.  We reviewed his medication regimen and plans for follow-up in 6 months.  We will continue his heart healthy low-sodium diet and physical activity.   Today he denies chest pain, shortness of breath, lower extremity edema, fatigue, palpitations, melena, hematuria, hemoptysis, diaphoresis, weakness, presyncope, syncope, orthopnea, and PND.  Home Medications    Prior to Admission medications   Medication Sig Start Date End Date Taking? Authorizing Provider  aspirin 81 MG EC tablet Take 1 tablet (81 mg total) by mouth daily. 08/11/21 08/11/22  Leonie Man, MD  atorvastatin (LIPITOR) 80 MG tablet Take 1 tablet (80 mg total) by mouth daily. 08/11/21   Leonie Man, MD  carvedilol (COREG) 3.125 MG tablet Take 1 tablet (3.125 mg total) by mouth 2 (two) times daily with a meal. 08/11/21   Leonie Man, MD  dapagliflozin propanediol (FARXIGA) 10 MG TABS tablet Take 1 tablet (10 mg  total) by mouth daily. 08/11/21   Leonie Man, MD  losartan (COZAAR) 25 MG tablet Take 1 tablet (25 mg total) by mouth daily. 08/19/21 08/19/22  Deberah Pelton, NP  metFORMIN (GLUCOPHAGE) 500 MG tablet Take one tablet po BID with meals 08/12/21   Leonie Man, MD  metFORMIN (GLUCOPHAGE) 500 MG tablet Take 500 mg by mouth 2 (two) times daily with a meal.    [provider]  nitroGLYCERIN (NITROSTAT) 0.4 MG SL tablet Place 1 tablet (0.4 mg total) under the tongue every 5 (five) minutes as needed for chest pain. 08/11/21   Leonie Man, MD  ticagrelor (BRILINTA) 90 MG TABS tablet Take 1 tablet (90 mg total) by mouth 2 (two) times daily. 08/11/21   Leonie Man, MD    Family History    History reviewed. No pertinent family history. has no family status information on file.    Social History    Social History   Socioeconomic History  Marital status: Married    Spouse name: Not on file   Number of children: Not on file   Years of education: Not on file   Highest education level: Not on file  Occupational History   Not on file  Tobacco Use   Smoking status: Former    Types: Cigarettes    Quit date: 11/22/2014    Years since quitting: 7.2   Smokeless tobacco: Never  Vaping Use   Vaping Use: Never used  Substance and Sexual Activity   Alcohol use: Yes   Drug use: Never   Sexual activity: Not on file  Other Topics Concern   Not on file  Social History Narrative   Not on file   Social Determinants of Health   Financial Resource Strain: Not on file  Food Insecurity: Not on file  Transportation Needs: Not on file  Physical Activity: Not on file  Stress: Not on file  Social Connections: Not on file  Intimate Partner Violence: Not on file     Review of Systems    General:  No chills, fever, night sweats or weight changes.  Cardiovascular:  No chest pain, dyspnea on exertion, edema, orthopnea, palpitations, paroxysmal nocturnal dyspnea. Dermatological:  No rash, lesions/masses Respiratory: No cough, dyspnea Urologic: No hematuria, dysuria Abdominal:   No nausea, vomiting, diarrhea, bright red blood per rectum, melena, or hematemesis Neurologic:  No visual changes, wkns, changes in mental status. All other systems reviewed and are otherwise negative except as noted above.  Physical Exam    VS:  BP 118/80 (BP Location: Left Arm, Patient Position: Sitting, Cuff Size: Normal)    Pulse (!) 59    Ht '5\' 10"'$  (1.778 m)    Wt 215 lb 3.2 oz (97.6 kg)    BMI 30.88 kg/m  , BMI Body mass index is 30.88 kg/m. GEN: Well nourished, well developed, in no acute distress. HEENT: normal. Neck: Supple, no JVD, carotid bruits, or masses. Cardiac: RRR, no murmurs, rubs, or gallops. No clubbing, cyanosis, edema.  Radials/DP/PT 2+ and equal bilaterally.  Respiratory:  Respirations regular and unlabored, clear to auscultation bilaterally. GI: Soft, nontender, nondistended, BS + x 4. MS: no deformity or atrophy. Skin: warm and dry, no rash. Neuro:  Strength and sensation are intact. Psych: Normal affect.  Accessory Clinical Findings    Recent Labs: 08/11/2021: Hemoglobin 17.0; Platelets 187 09/15/2021: ALT 27 09/26/2021: BUN 19; Creatinine, Ser 0.87; Potassium 4.1; Sodium 140   Recent Lipid Panel    Component Value Date/Time   CHOL 122 09/15/2021 0951   TRIG 112 09/15/2021 0951   HDL 43 09/15/2021 0951   CHOLHDL 2.8 09/15/2021 0951   CHOLHDL 3.8 08/10/2021 0108   VLDL 33 08/10/2021 0108   LDLCALC 59 09/15/2021 0951    ECG personally reviewed by me today-EKG today shows sinus bradycardia low voltage QRS anterior septal infarct undetermined age 37 bpm-no acute changes.  EKG 08/19/2021 normal sinus rhythm low voltage QRS atrial septal infarct undetermined age 56 bpm- No acute changes   Echocardiogram 11/03/2021  IMPRESSIONS     1. Left ventricular ejection fraction, by estimation, is 55 to 60%. The  left ventricle has normal function. The left  ventricle demonstrates  regional wall motion abnormalities. The mid-to-apical septal and apex  appear hypokinetic. There is severe  hypertrophy of the septal segments. The rest of the LV segments  demonstrate mild left ventricular hypertrophy. Left ventricular diastolic  parameters are consistent with Grade I diastolic dysfunction (impaired  relaxation).  2. Right ventricular systolic function is normal. The right ventricular  size is normal. Tricuspid regurgitation signal is inadequate for assessing  PA pressure.   3. The mitral valve is normal in structure. Trivial mitral valve  regurgitation.   4. The aortic valve is tricuspid. There is mild calcification of the  aortic valve. There is mild thickening of the aortic valve. Aortic valve  regurgitation is not visualized.   5. The inferior vena cava is normal in size with greater than 50%  respiratory variability, suggesting right atrial pressure of 3 mmHg.   Comparison(s): Compared to prior TTE in 07/2021, the LVEF has  significantly improved to 55% (previously 35-40%) with improved wall  motion.  Echocardiogram 08/11/2021 IMPRESSIONS     1. Severe hypokinesis/Akinesis of the distal inferior, mid/distal  inferoseptal and apical walls. . Left ventricular ejection fraction, by  estimation, is 35 to 40%. The left ventricle has moderately decreased  function. There is severe asymmetric left  ventricular hypertrophy. Left ventricular diastolic parameters are  consistent with Grade I diastolic dysfunction (impaired relaxation).  Elevated left atrial pressure.   2. Right ventricular systolic function is normal. The right ventricular  size is normal.   3. The mitral valve is normal in structure. Trivial mitral valve  regurgitation.   4. The aortic valve is normal in structure. Aortic valve regurgitation is  not visualized.   5. There is mild dilatation of the aortic root, measuring 38 mm.   Cardiac catheterization 08/09/2021   Prox  LAD to Mid LAD lesion is 60% stenosed with 30% stenosed side branch in 2nd Diag.   Mid LAD-1 lesion is 100% stenosed just distal to 2nd Diag   A drug-eluting stent was successfully placed covering the upstream 60% stenosis and the original on her present thrombotic occlusion site, using a STENT ONYX FRONTIER 3.0X18.  Taper post dilation from 4.1 (prior to the side branch to 3.3 after the side branch.   Post intervention, there is a 0% residual stenosisn of the original occlusion site and upstream lesion.  Post intervention, the 2nd Diag side branch was reduced to 30% residual stenosis.   Mid LAD-2 lesion is 70% stenosed -> originally thought to be spasm, however appeared to be persistent despite intracoronary nitroglycerin and angioplasty.   A second, overlapping drug-eluting stent was successfully placed to cover the mid LAD 70% lesions and overlapping proximally, using a STENT ONYX FRONTIER 3.0X26.  Postdilated to 3.3 mm.   Post intervention, there is a 0% residual stenosis of the additional stented site.Marland Kitchen   ------------------------------------------------------------------------------------   Colon Flattery RCA lesion is 30% stenosed.   The remainder of the coronaries including the RCA and LCx are at least mild to moderately ectatic.   ------------------------------------------------------------------------------------   There is moderate to severe left ventricular systolic dysfunction.  The left ventricular ejection fraction is 30-35% by visual estimate.   LV end diastolic pressure is severely elevated - 22-24 mmhg   There is mild (2+) mitral regurgitation.   SUMMARY Acute anterior ST elevation MI due to 100% occlusion of the mid LAD just after 2nd Diag branch. Successful PTCA-DES PCI of a long segment in the mid LAD using 2 overlapping Onyx Frontier DES stents: 3.0 mm x 18 mm, overlapped distally with a 3.0 mm x 26 mm for continuing disease initially felt to be spasm -> stents were postdilated to 4.1 mm  proximal to the diagonal branch, and 3.  3 mm throughout the stented segment Generally large ectatic vessels with mild luminal  irregularities throughout.  Very difficult to feel due to the ectasia and tortuosity of vessels, but no other significant lesions identified. 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   RECOMMENDATIONS CVICU admission.  Gentle post-cath hydration to avoid pulmonary edema given elevated LVEDP. Low threshold for diuresis Due to possible stunning of the myocardium, would recommend waiting until 08/11/2021 to check echocardiogram and assess EF.  If EF less than 35%, would consider LifeVest on discharge. Add low-dose carvedilol 3.125 mg twice daily starting tonight if pressures allow. If pressures are stable, consider ARB prior to discharge Delene Loll if indicated by LVEF reduction) Will start Farxiga (SGLT2 inhibitor) High-dose high intensity statin Long-term DAPT       Glenetta Hew, MD   Diagnostic Dominance: Right Intervention      Assessment & Plan   1.  Ischemic cardiomyopathy-remains physically active walking regularly.  No increased DOE or activity intolerance.  Echocardiogram 11/03/2021 showed EF of 55-60%, G1 DD, and trivial mitral valve regurgitation.  EF improved from 35-40% Continue carvedilol, Lisabeth Register 24-26 Continue physical activity  Coronary artery disease-denies recent episodes of chest discomfort/pain.  Post STEMI, underwent cardiac catheterization on 08/09/2021 and received DES x2 to his mid LAD.  Echocardiogram showed a slight improvement in his LVEF up to 35-40%. Continue carvedilol, aspirin, Brilinta, Farxiga, atorvastatin Heart healthy low-sodium diet-salty 6 given Continue physical activity   Hyperlipidemia-08/10/2021: VLDL 33 09/15/2021: Cholesterol, Total 122; HDL 43; LDL Chol Calc (NIH) 59; Triglycerides 112 Continue atorvastatin, aspirin Heart  healthy low-sodium high-fiber diet Continue physical activity  Repeat fasting lipids and LFTs 9/23   Disposition: Follow-up with Dr. Ellyn Hack or me in 6 months   Jossie Ng. Cassiel Fernandez NP-C    02/02/2022, 8:16 AM [place] [address] Office [phone] Fax [phone]  Notice: This dictation was prepared with Dragon dictation along with smaller phrase technology. Any transcriptional errors that result from this process are unintentional and may not be corrected upon review.  I spent 12 minutes examining this patient, reviewing medications, and using patient centered shared decision making involving her cardiac care.  Prior to her visit I spent greater than 20 minutes reviewing her past medical history,  medications, and prior cardiac tests.

## 2022-02-02 ENCOUNTER — Other Ambulatory Visit: Payer: Self-pay

## 2022-02-02 ENCOUNTER — Encounter: Payer: Self-pay | Admitting: General Practice

## 2022-02-02 ENCOUNTER — Ambulatory Visit (INDEPENDENT_AMBULATORY_CARE_PROVIDER_SITE_OTHER): Payer: PRIVATE HEALTH INSURANCE | Admitting: General Practice

## 2022-02-02 VITALS — BP 118/80 | HR 59 | Ht 70.0 in | Wt 215.2 lb

## 2022-02-02 DIAGNOSIS — I255 Ischemic cardiomyopathy: Secondary | ICD-10-CM | POA: Diagnosis not present

## 2022-02-02 DIAGNOSIS — E785 Hyperlipidemia, unspecified: Secondary | ICD-10-CM | POA: Diagnosis not present

## 2022-02-02 DIAGNOSIS — I2510 Atherosclerotic heart disease of native coronary artery without angina pectoris: Secondary | ICD-10-CM

## 2022-02-02 NOTE — Addendum Note (Signed)
Addended by: Deberah Pelton on: 02/02/2022 08:20 AM ? ? Modules accepted: Level of Service ? ?

## 2022-02-02 NOTE — Patient Instructions (Signed)
Medication Instructions:  ?The current medical regimen is effective;  continue present plan and medications as directed. Please refer to the Current Medication list given to you today.  ? ?*If you need a refill on your cardiac medications before your next appointment, please call your pharmacy* ? ?Lab Work:   Testing/Procedures:  ?NONE    NONE ? ?Special Instructions ?PLEASE CONTINUE DIET ? ?PLEASE CONTINUE PHYSICAL ACTIVITY AS TOLERATED  ? ?Follow-Up: ?Your next appointment:  6 month(s) In Person with Glenetta Hew, MD  ? ?Please call our office 2 months in advance to schedule this appointment  :1 ? ?At [facility], you and your health needs are our priority.  As part of our continuing mission to provide you with exceptional heart care, we have created designated Provider Care Teams.  These Care Teams include your primary Cardiologist (physician) and Advanced Practice Providers (APPs -  Physician Assistants and Nurse Practitioners) who all work together to provide you with the care you need, when you need it. ? ? ? ?        ? ?

## 2022-02-09 ENCOUNTER — Other Ambulatory Visit: Payer: Self-pay | Admitting: General Practice

## 2022-06-24 ENCOUNTER — Encounter: Payer: Self-pay | Admitting: Family Medicine

## 2022-06-30 ENCOUNTER — Encounter: Payer: Self-pay | Admitting: Family Medicine

## 2022-06-30 ENCOUNTER — Ambulatory Visit (INDEPENDENT_AMBULATORY_CARE_PROVIDER_SITE_OTHER): Payer: PRIVATE HEALTH INSURANCE | Admitting: Family Medicine

## 2022-06-30 VITALS — BP 116/79 | HR 71 | Temp 97.2°F | Ht 70.0 in | Wt 205.0 lb

## 2022-06-30 DIAGNOSIS — E1169 Type 2 diabetes mellitus with other specified complication: Secondary | ICD-10-CM | POA: Diagnosis not present

## 2022-06-30 DIAGNOSIS — E118 Type 2 diabetes mellitus with unspecified complications: Secondary | ICD-10-CM | POA: Diagnosis not present

## 2022-06-30 DIAGNOSIS — Z130 Encounter for screening for diseases of the blood and blood-forming organs and certain disorders involving the immune mechanism: Secondary | ICD-10-CM

## 2022-06-30 DIAGNOSIS — I2510 Atherosclerotic heart disease of native coronary artery without angina pectoris: Secondary | ICD-10-CM

## 2022-06-30 DIAGNOSIS — Z125 Encounter for screening for malignant neoplasm of prostate: Secondary | ICD-10-CM

## 2022-06-30 DIAGNOSIS — E785 Hyperlipidemia, unspecified: Secondary | ICD-10-CM

## 2022-06-30 NOTE — Progress Notes (Signed)
Subjective:  Patient ID: Jeff Mccarthy, male    DOB: 03/12/1966  Age: 56 y.o. MRN: [id number]  CC: Chief Complaint  Patient presents with   Annual Exam    Pt arrives for follow up. Pt states doing well.     HPI:  56 year old male with coronary artery disease with history of STEMI status post PCI, history of heart failure with echocardiogram now with normal EF, hyperlipidemia, type 2 diabetes presents for follow-up.  Patient states that he is doing well.  Denies chest pain or shortness of breath.  Saw cardiology in March.  Remains on Lipitor, carvedilol, Entresto, Brilinta.  Lipids have been at goal on Lipitor.  Denies any adverse side effects.  Patient's blood pressure is well controlled.  Patient's diabetes has been well controlled with dietary and lifestyle changes as well as metformin.  Most recent A1c was 6.2 in February.   Patient Active Problem List   Diagnosis Date Noted   CAD (coronary artery disease) 01/14/2022   Ischemic cardiomyopathy 01/14/2022   Chronic combined systolic and diastolic heart failure ([place]) 01/14/2022   Hyperlipidemia associated with type 2 diabetes mellitus ([place]) 08/09/2021   Type 2 diabetes mellitus with complication, without long-term current use of insulin ([place]) 04/20/2015    Social Hx   Social History   Socioeconomic History   Marital status: Married    Spouse name: Not on file   Number of children: Not on file   Years of education: Not on file   Highest education level: Not on file  Occupational History   Not on file  Tobacco Use   Smoking status: Former    Types: Cigarettes    Quit date: 11/22/2014    Years since quitting: 7.6   Smokeless tobacco: Never  Vaping Use   Vaping Use: Never used  Substance and Sexual Activity   Alcohol use: Yes   Drug use: Never   Sexual activity: Not on file  Other Topics Concern   Not on file  Social History Narrative   Not on file   Social Determinants of Health   Financial Resource  Strain: Not on file  Food Insecurity: Not on file  Transportation Needs: Not on file  Physical Activity: Not on file  Stress: Not on file  Social Connections: Not on file    Review of Systems Per HPI  Objective:  BP 116/79   Pulse 71   Temp (!) 97.2 F (36.2 C)   Ht '5\' 10"'  (1.778 m)   Wt 205 lb (93 kg)   SpO2 99%   BMI 29.41 kg/m      06/30/2022    8:57 AM 02/02/2022    8:08 AM 01/14/2022    8:22 AM  BP/Weight  Systolic BP 614 431 540  Diastolic BP 79 80 70  Wt. (Lbs) 205 215.2 213  BMI 29.41 kg/m2 30.88 kg/m2 30.56 kg/m2    Physical Exam Vitals and nursing note reviewed.  Constitutional:      General: He is not in acute distress.    Appearance: Normal appearance.  HENT:     Head: Normocephalic and atraumatic.  Eyes:     General:        Right eye: No discharge.        Left eye: No discharge.     Conjunctiva/sclera: Conjunctivae normal.  Cardiovascular:     Rate and Rhythm: Normal rate and regular rhythm.  Pulmonary:     Effort: Pulmonary effort is normal.  Breath sounds: Normal breath sounds. No wheezing, rhonchi or rales.  Neurological:     Mental Status: He is alert.  Psychiatric:        Mood and Affect: Mood normal.        Behavior: Behavior normal.     Lab Results  Component Value Date   WBC 9.8 08/11/2021   HGB 17.0 08/11/2021   HCT 48.7 08/11/2021   PLT 187 08/11/2021   GLUCOSE 102 (H) 09/26/2021   CHOL 122 09/15/2021   TRIG 112 09/15/2021   HDL 43 09/15/2021   LDLCALC 59 09/15/2021   ALT 27 09/15/2021   AST 22 09/15/2021   NA 140 09/26/2021   K 4.1 09/26/2021   CL 98 09/26/2021   CREATININE 0.87 09/26/2021   BUN 19 09/26/2021   CO2 24 09/26/2021   HGBA1C 6.2 (H) 01/14/2022     Assessment & Plan:   Problem List Items Addressed This Visit       Cardiovascular and Mediastinum   CAD (coronary artery disease) - Primary    Stable and asymptomatic currently.  Continue current medications.        Endocrine   Hyperlipidemia  associated with type 2 diabetes mellitus (HCC) (Chronic)    Awaiting lipid panel.  Has been at goal.  Continue Lipitor.      Relevant Orders   Lipid panel   Type 2 diabetes mellitus with complication, without long-term current use of insulin (HCC) (Chronic)    Has been stable.  Awaiting A1c.  Continue metformin.      Relevant Orders   CMP14+EGFR   Microalbumin / creatinine urine ratio   Hemoglobin A1c   Other Visit Diagnoses     Screening for deficiency anemia       Relevant Orders   CBC   Prostate cancer screening       Relevant Orders   PSA      Follow-up:  Return in about 6 months (around 12/31/2022).  [place]

## 2022-06-30 NOTE — Assessment & Plan Note (Signed)
Awaiting lipid panel.  Has been at goal.  Continue Lipitor.

## 2022-06-30 NOTE — Patient Instructions (Signed)
Labs ordered.  Follow up in 6 months.  Take care  Dr. Lacinda Axon

## 2022-06-30 NOTE — Assessment & Plan Note (Signed)
Has been stable.  Awaiting A1c.  Continue metformin.

## 2022-06-30 NOTE — Assessment & Plan Note (Signed)
Stable and asymptomatic currently.  Continue current medications.

## 2022-07-01 LAB — LIPID PANEL
Chol/HDL Ratio: 2.4 ratio (ref 0.0–5.0)
Cholesterol, Total: 102 mg/dL (ref 100–199)
HDL: 42 mg/dL (ref >39)
LDL Chol Calc (NIH): 42 mg/dL (ref 0–99)
Triglycerides: 93 mg/dL (ref 0–149)
VLDL Cholesterol Cal: 18 mg/dL (ref 5–40)

## 2022-07-01 LAB — CMP14+EGFR
ALT: 17 IU/L (ref 0–44)
AST: 16 IU/L (ref 0–40)
Albumin/Globulin Ratio: 2.1 (ref 1.2–2.2)
Albumin: 5.2 g/dL — ABNORMAL HIGH (ref 3.8–4.9)
Alkaline Phosphatase: 54 IU/L (ref 44–121)
BUN/Creatinine Ratio: 20 (ref 9–20)
BUN: 16 mg/dL (ref 6–24)
Bilirubin Total: 0.7 mg/dL (ref 0.0–1.2)
CO2: 24 mmol/L (ref 20–29)
Calcium: 10.2 mg/dL (ref 8.7–10.2)
Chloride: 101 mmol/L (ref 96–106)
Creatinine, Ser: 0.82 mg/dL (ref 0.76–1.27)
Globulin, Total: 2.5 g/dL (ref 1.5–4.5)
Glucose: 112 mg/dL — ABNORMAL HIGH (ref 70–99)
Potassium: 4.3 mmol/L (ref 3.5–5.2)
Sodium: 143 mmol/L (ref 134–144)
Total Protein: 7.7 g/dL (ref 6.0–8.5)
eGFR: 104 mL/min/{1.73_m2} (ref >59)

## 2022-07-01 LAB — MICROALBUMIN / CREATININE URINE RATIO
Creatinine, Urine: 68.1 mg/dL
Microalb/Creat Ratio: 11 mg/g creat (ref 0–29)
Microalbumin, Urine: 7.8 ug/mL

## 2022-07-01 LAB — CBC
Hematocrit: 50.8 % (ref 37.5–51.0)
Hemoglobin: 17 g/dL (ref 13.0–17.7)
MCH: 31.3 pg (ref 26.6–33.0)
MCHC: 33.5 g/dL (ref 31.5–35.7)
MCV: 93 fL (ref 79–97)
Platelets: 213 10*3/uL (ref 150–450)
RBC: 5.44 x10E6/uL (ref 4.14–5.80)
RDW: 12.6 % (ref 11.6–15.4)
WBC: 7.7 10*3/uL (ref 3.4–10.8)

## 2022-07-01 LAB — HEMOGLOBIN A1C
Est. average glucose Bld gHb Est-mCnc: 120 mg/dL
Hgb A1c MFr Bld: 5.8 % — ABNORMAL HIGH (ref 4.8–5.6)

## 2022-07-01 LAB — PSA: Prostate Specific Ag, Serum: 0.5 ng/mL (ref 0.0–4.0)

## 2022-07-03 ENCOUNTER — Other Ambulatory Visit: Payer: Self-pay | Admitting: Physician Assistant

## 2022-07-30 ENCOUNTER — Ambulatory Visit (INDEPENDENT_AMBULATORY_CARE_PROVIDER_SITE_OTHER): Payer: PRIVATE HEALTH INSURANCE | Admitting: Nurse Practitioner

## 2022-07-30 ENCOUNTER — Encounter: Payer: Self-pay | Admitting: Nurse Practitioner

## 2022-07-30 VITALS — BP 119/79 | HR 74 | Temp 98.2°F | Wt 213.8 lb

## 2022-07-30 DIAGNOSIS — S91001A Unspecified open wound, right ankle, initial encounter: Secondary | ICD-10-CM

## 2022-07-30 MED ORDER — MUPIROCIN 2 % EX OINT: 1.0000 | TOPICAL_OINTMENT | Freq: Two times a day (BID) | CUTANEOUS | 0 refills | Status: DC

## 2022-07-30 MED ORDER — DOXYCYCLINE HYCLATE 100 MG PO TABS: 100.0000 mg | ORAL_TABLET | Freq: Two times a day (BID) | ORAL | 0 refills | Status: AC

## 2022-07-30 MED ORDER — CIPROFLOXACIN HCL 500 MG PO TABS: 500.0000 mg | ORAL_TABLET | Freq: Two times a day (BID) | ORAL | 0 refills | Status: AC

## 2022-07-30 NOTE — Progress Notes (Signed)
    Subjective:    Patient ID: Jeff Mccarthy, male    DOB: 1966-04-21, 56 y.o.   MRN: [id number]  HPI  Pt arrives with scrape on right ankle x 4 days. Pt spent time in the [facility] and scraped in on a rock. Patient reports also kayaking in the water/river while at the [facility]. Pt has been using neosporin and cleaning area. Area is draining and has red ring around it.  Patient also reports increased tenderness around the wound.   Patient has history of diabetes. Last A1c 5.8.   Patient denies any fevers, chills, body aches.    Review of Systems  Skin:  Positive for wound.  All other systems reviewed and are negative.      Objective:   Physical Exam Vitals reviewed.  Constitutional:      General: He is not in acute distress.    Appearance: Normal appearance. He is obese. He is not ill-appearing, toxic-appearing or diaphoretic.  HENT:     Head: Normocephalic and atraumatic.  Musculoskeletal:     Comments: Grossly intact  Skin:    General: Skin is warm.     Capillary Refill: Capillary refill takes less than 2 seconds.     Findings: Lesion present.     Comments: Wound approximately 2-3 cm. Redness noted around wound. Serous drainage noted on exam. No purulent drainage noted, however wound covered with cream colored scab. Tender to palpation.   Neurological:     Mental Status: He is alert.     Comments: Grossly intact  Psychiatric:        Mood and Affect: Mood normal.        Behavior: Behavior normal.           Assessment & Plan:   1. Wound of right ankle, initial encounter - Due to patient's history of diabetes and exposure to river/creek water will cover patient for both MRSA and Pseudomonas.  - mupirocin ointment (BACTROBAN) 2 %; Apply 1 Application topically 2 (two) times daily.  Dispense: 22 g; Refill: 0 - ciprofloxacin (CIPRO) 500 MG tablet; Take 1 tablet (500 mg total) by mouth 2 (two) times daily for 7 days.  Dispense: 14 tablet; Refill: 0 -  doxycycline (VIBRA-TABS) 100 MG tablet; Take 1 tablet (100 mg total) by mouth 2 (two) times daily for 7 days.  Dispense: 14 tablet; Refill: 0 - Discussed side effects of Cipro in detail. - Return to clinic in 7 days for evaluation. If area healing well, may cancel appointment

## 2022-07-30 NOTE — Progress Notes (Signed)
Pt contacted and has appt set up for 08/05/22 with Barbee Shropshire to recheck wound

## 2022-07-31 ENCOUNTER — Encounter: Payer: Self-pay | Admitting: Nurse Practitioner

## 2022-08-05 ENCOUNTER — Ambulatory Visit: Payer: PRIVATE HEALTH INSURANCE | Admitting: Nurse Practitioner

## 2022-08-05 NOTE — Progress Notes (Signed)
Primary Care Provider: Coral Spikes, DO [place] cardiologist: Glenetta Hew, MD Electrophysiologist: None  Clinic Note: Chief Complaint  Patient presents with   Follow-up    69-monthlast seen in March by JColetta Memos NP   Coronary Artery Disease    Anterior STEMI 08/09/2021-LAD PCI.  Resolved ICM. No angina or heart failure.  Doing well   ===================================  ASSESSMENT/PLAN   Problem List Items Addressed This Visit       Cardiology Problems   CAD S/P DES PCI mid to distal LAD - Primary (Chronic)    Doing remarkably well.  Has had no major issues since PCI.  No angina or heart failure symptoms.  He does have some bruising on combination of aspirin and Plavix.  He is now 1 year out from his PCI and doing well.  Plan:  Continue current dose of carvedilol, Lipitor and Entresto along with FIran Convert from treatment to maintenance dose of Brilinta (90 mg to 60 mg), and stop aspirin. => Okay to interrupt Brilinta as of 08/09/2022.. As of 08/09/2022, okay to hold Brilinta 5 to 7 days preop for surgeries or procedures.   5 days for minor procedures, 7 days from injury) restart within 2 days.  First dose (restarting would be 2 tablets)        Relevant Medications   sacubitril-valsartan (ENTRESTO) 24-26 MG   carvedilol (COREG) 3.125 MG tablet   atorvastatin (LIPITOR) 80 MG tablet   ST elevation myocardial infarction (STEMI) of anterior wall, subsequent episode of care ([facility] (Chronic)    1 year out from STEMI.  Recovered from ischemic cardiomyopathy.  No active angina.  On stable regimen.  This definitely scared him and he has been making a concerted effort to stay active and healthy and losing weight.  Recovering well.      Relevant Medications   sacubitril-valsartan (ENTRESTO) 24-26 MG   carvedilol (COREG) 3.125 MG tablet   atorvastatin (LIPITOR) 80 MG tablet   Chronic combined systolic and diastolic heart failure (HCC) (Chronic)     Thankfully, his EF improved back to her normal baseline EF.  There is some apical wall motion abnormality consistent with prior MI, but NYHA Class I symptoms with no real heart failure to speak of.  Maybe some mild minimal fatigue and exercise dyspnea as well as edema.  Appropriate otherwise pretty much euvolemic and doing well.  Not requiring diuretic.  On appropriate meds including carvedilol and Entresto (not able to titrate further because of low blood pressures. Is also on Farxiga and not requiring diuretic.      Relevant Medications   sacubitril-valsartan (ENTRESTO) 24-26 MG   carvedilol (COREG) 3.125 MG tablet   atorvastatin (LIPITOR) 80 MG tablet   Ischemic Cardiomyopathy-Resolved (Chronic)    Presents.  No active heart failure symptoms.  NYHA Class I. Euvolemic.  Is on stable dose of carvedilol and Entresto, blood pressure below 100, no room to titrate further. Diuretic.  Only on Farxiga.      Relevant Medications   sacubitril-valsartan (ENTRESTO) 24-26 MG   carvedilol (COREG) 3.125 MG tablet   atorvastatin (LIPITOR) 80 MG tablet   Other Relevant Orders   EKG 12-Lead   Hyperlipidemia associated with type 2 diabetes mellitus ([place] (Chronic)    Labs just checked in August to have LDL of 42 and A1c of 5.8.  Outstanding. He is very active, monitoring his diet and also on good combination medications.  Plan:  Continue 80 mg Lipitor for now,  if  he does have LDL that is still below 50 on this high dose, would consider potentially reducing to 40 mg dose to avoid adverse reactions. On combination low-dose metformin along with Wilder Glade which has combined CHF and diabetes effect.       Relevant Medications   sacubitril-valsartan (ENTRESTO) 24-26 MG   dapagliflozin propanediol (FARXIGA) 10 MG TABS tablet   carvedilol (COREG) 3.125 MG tablet   atorvastatin (LIPITOR) 80 MG tablet    ===================================  HPI:    Jeff Mccarthy is a 56 y.o. male with a PMH below  who presents today for 11-monthfollow-up at the request of CCoral Spikes DO.  08/09/2021: 1 V CAD; -> Anterior STEMI:  p LAD 30=> p-mLAD 60%=> mLAD (after D2) 100%-70% => 2 overlapping DES PCI Onyx Frontier prox-mid LAD (3.0 x 18 and 3.0 x 26-tapered postdilation 4.1-3.3 mm. Ischemic CM - resolved: => Post MI EF 35 to 40% with distal inferior, mid to distal inferoseptal and apical wall hypokinesis to akinesis. 10/2021: EF 55 to 60%.  Mid to apical septal and apical wall hypokinetic.  Septal hypertrophy.  GR 1 DD.  JARIYAN BRISENDINEwas last seen on February 02, 2022 by JColetta Memos NP as a 3rd post hospital follow-up.  No longer having any issues with chest pain.  Very pleased with his outcome.  Pleased with the echo results.  Walking 2 miles 3 to 4 days a week.  [facility]  No angina or heart failure symptoms. No med changes made.  Had previously increased up to full 25 mg losartan.  Recent Hospitalizations: None  Reviewed  CV studies:    The following studies were reviewed today: (if available, images/films reviewed: From Epic Chart or Care Everywhere) Catheter and PCI (08/10/2021): TTE (08/11/2021):  TTE (11/03/2021):   Interval History:   JBACH ROCCHIreturns for routine follow-up doing quite well.  [place]  He has remained very active and healthy since his MI.  He only notes some mild orthostatic dizziness when he first stands up or when he bends down and leans up.  But otherwise has not no major issues. He and his wife continue to walk about 2 miles a day 4 to 5 days a week without any difficulty.  He denies any anginal symptoms or claudication.  No CHF symptoms of PND, orthopnea or edema.  His blood pressure is a little low today, but he denies any significant dizziness at this point.  The only thing he notes is some bruising. No melena, hematochezia, hematuria or epistaxis.  No syncope  or near syncope. He has lost about 10 pounds since his last visit with JDenyse Amass  CV Review of Symptoms (Summary): no chest pain or dyspnea on exertion positive for - Every now & then lightheaded with bending over & sitting up - immediate recovery; mild diffuse bruising. negative for - edema, irregular heartbeat, orthopnea, palpitations, paroxysmal nocturnal dyspnea, rapid heart rate, shortness of breath, or syncope/near syncope, TIA/amaurosis fugax, claudication  During his white water rafting trip in he did have some scrapes on his leg and one on the Affected.  He just completed a course of doxycycline and Cipro.  REVIEWED OF SYSTEMS   Review of Systems  Constitutional:  Positive for weight loss. Negative for malaise/fatigue.  Respiratory:  Negative for cough and shortness of breath.   Musculoskeletal:  Negative for joint pain and  myalgias.  All other systems reviewed and are negative.   I have reviewed and (if needed) personally updated the patient's problem list, medications, allergies, past medical and surgical history, social and family history.   PAST MEDICAL HISTORY   Past Medical History:  Diagnosis Date   CAD S/P DES PCI mid to distal LAD 08/09/2021   Anterior STEMI =-PCI : p-m LAD 60% stenosis, 30% stenosis D2 and mid LAD lesion 100% stenosis just distal to D2.  DES PCI: Onyx Frontier DES 3 x 18 & 3 x 26 (tapered 4.1-3.12m)   Diabetes mellitus without complication ([place]    Hyperlipidemia associated with type 2 diabetes mellitus ([place] 08/09/2021   Ischemic dilated cardiomyopathy ([place] 07/2021   In the setting of anterior STEMI with occluded LAD treated with PCI.  Initial EF was 35 to 40%.  Follow-up EF 55 to 30% -> on low-dose carvedilol and Entresto along with Farxiga   ST elevation (STEMI) myocardial infarction involving left anterior descending coronary artery ([place] 08/09/2021   Mid to distal LAD occlusion-DES PCI with 2 overlapping stents:  Onyx Frontier DES 3 x 18 & 3 x 26  (tapered 4.1-3.358m.  Jailed D2 with 60% ostial reduced to 30%.    PAST SURGICAL HISTORY   Past Surgical History:  Procedure Laterality Date   COLONOSCOPY     COLONOSCOPY N/A 07/08/2018   Procedure: COLONOSCOPY;  Surgeon: RoDaneil DolinMD;  [facility];  Service: Endoscopy;  Laterality: N/A;  12:15   COLONOSCOPY N/A 07/13/2018   Procedure: COLONOSCOPY;  Surgeon: RoDaneil DolinMD;  [facility];  Service: Endoscopy;  Laterality: N/A;   CORONARY STENT INTERVENTION N/A 08/09/2021   Procedure: CORONARY STENT INTERVENTION;  Surgeon: HaLeonie ManMD;  [facility];  Service: Cardiovascular;  Laterality: N/A;   CORONARY/GRAFT ACUTE MI REVASCULARIZATION N/A 08/09/2021   Procedure: Coronary/Graft Acute MI Revascularization;  Surgeon: HaLeonie ManMD;  [facility];  Service: Cardiovascular;  Laterality: N/A;   LEFT HEART CATH AND CORONARY ANGIOGRAPHY N/A 08/09/2021   Procedure: LEFT HEART CATH AND CORONARY ANGIOGRAPHY;  Surgeon: HaLeonie ManMD;  [facility];  Service: Cardiovascular;  Laterality: N/A;   NECK SURGERY     C5, C6   POLYPECTOMY     POLYPECTOMY  07/08/2018   Procedure: POLYPECTOMY;  Surgeon: RoDaneil DolinMD;  [facility];  Service: Endoscopy;;  cecal, hepatic flexure,   Diagnostic    Intervention: Overlapping Onyx Frontier DES 3 x 18 & 3 x 26 (tapered 4.1-3.58m65m   Immunization History  Administered Date(s) Administered   Influenza-Unspecified 08/23/2014, 08/28/2015   Moderna Sars-Covid-2 Vaccination 12/19/2019, 01/19/2020   Tdap 11/06/2015    MEDICATIONS/ALLERGIES   Current Meds  Medication Sig   aspirin 81 MG EC tablet Take 1 tablet (81 mg total) by mouth daily.   atorvastatin (LIPITOR) 80 MG tablet TAKE 1 TABLET BY MOUTH ONCE A DAY.   carvedilol (COREG) 3.125 MG tablet Take 1 tablet (3.125 mg total) by mouth 2 (two) times daily with a meal.   ciprofloxacin (CIPRO) 500  MG tablet Take 1 tablet (500 mg total) by mouth 2 (two) times daily for 7 days.   dapagliflozin propanediol (FARXIGA) 10 MG TABS tablet Take 1 tablet (10 mg total) by mouth daily.   doxycycline (VIBRA-TABS) 100 MG tablet Take 1 tablet (100 mg total) by mouth 2 (two) times daily for 7 days.   metFORMIN (  GLUCOPHAGE) 500 MG tablet Take 1 tablet (500 mg total) by mouth 2 (two) times daily with a meal.   mupirocin ointment (BACTROBAN) 2 % Apply 1 Application topically 2 (two) times daily.   nitroGLYCERIN (NITROSTAT) 0.4 MG SL tablet Place 1 tablet (0.4 mg total) under the tongue every 5 (five) minutes as needed for chest pain.   sacubitril-valsartan (ENTRESTO) 24-26 MG TAKE (1) TABLET BY MOUTH TWICE DAILY.   ticagrelor (BRILINTA) 90 MG TABS tablet Take 1 tablet (90 mg total) by mouth 2 (two) times daily.    Allergies  Allergen Reactions   Biaxin [Clarithromycin] Other (See Comments)    GI upset    SOCIAL HISTORY/FAMILY HISTORY   Reviewed in Epic:  Pertinent findings:  Social History   Tobacco Use   Smoking status: Former    Types: Cigarettes    Quit date: 11/22/2014    Years since quitting: 7.7   Smokeless tobacco: Never  Vaping Use   Vaping Use: Never used  Substance Use Topics   Alcohol use: Yes   Drug use: Never   Social History   Social History Narrative   Not on file    OBJCTIVE -PE, EKG, labs   Wt Readings from Last 3 Encounters:  08/06/22 203 lb 3.2 oz (92.2 kg)  07/30/22 213 lb 12.8 oz (97 kg)  06/30/22 205 lb (93 kg)    Physical Exam: BP 96/68   Pulse 68   Ht '5\' 10"'$  (1.778 m)   Wt 203 lb 3.2 oz (92.2 kg)   SpO2 98%   BMI 29.16 kg/m  Physical Exam Constitutional:      General: He is not in acute distress.    Appearance: Normal appearance. He is not ill-appearing or toxic-appearing.     Comments: No longer obese.  HENT:     Head: Normocephalic.  Neck:     Vascular: No carotid bruit.  Cardiovascular:     Rate and Rhythm: Normal rate and regular  rhythm.     Pulses: Normal pulses.     Heart sounds: Normal heart sounds. No murmur heard.    No friction rub. No gallop.  Pulmonary:     Effort: Pulmonary effort is normal. No respiratory distress.     Breath sounds: Normal breath sounds. No wheezing, rhonchi or rales.  Chest:     Chest wall: No tenderness.  Musculoskeletal:        General: No swelling. Normal range of motion.     Cervical back: Normal range of motion and neck supple.  Skin:    General: Skin is warm and dry.     Coloration: Skin is pale. Skin is not jaundiced.     Findings: Bruising present.  Neurological:     General: No focal deficit present.     Mental Status: He is alert and oriented to person, place, and time.     Gait: Gait normal.  Psychiatric:        Mood and Affect: Mood normal.        Behavior: Behavior normal.        Thought Content: Thought content normal.        Judgment: Judgment normal.     Adult ECG Report  Rate: 68 ;  Rhythm: normal sinus rhythm, sinus arrhythmia, and AMI,age undetermined ;   Narrative Interpretation: stable  Recent Labs:  reviewed  Lab Results  Component Value Date   CHOL 102 06/30/2022   HDL 42 06/30/2022   LDLCALC 42 06/30/2022  TRIG 93 06/30/2022   CHOLHDL 2.4 06/30/2022   Lab Results  Component Value Date   CREATININE 0.82 06/30/2022   BUN 16 06/30/2022   NA 143 06/30/2022   K 4.3 06/30/2022   CL 101 06/30/2022   CO2 24 06/30/2022      Latest Ref Rng & Units 06/30/2022    9:53 AM 08/11/2021    3:13 AM 08/10/2021    1:08 AM  CBC  WBC 3.4 - 10.8 x10E3/uL 7.7  9.8  10.7   Hemoglobin 13.0 - 17.7 g/dL 17.0  17.0  16.0   Hematocrit 37.5 - 51.0 % 50.8  48.7  45.9   Platelets 150 - 450 x10E3/uL 213  187  183     Lab Results  Component Value Date   HGBA1C 5.8 (H) 06/30/2022   No results found for: "TSH"  ================================================== I spent a total of 22 minutes with the patient spent in direct patient consultation.  Additional time  spent with chart review  / charting (studies, outside notes, etc): 20 min Total Time: 42 min  Current medicines are reviewed at length with the patient today.  (+/- concerns) no complaints.  Notice: This dictation was prepared with Dragon dictation along with smart phrase technology. Any transcriptional errors that result from this process are unintentional and may not be corrected upon review.  Studies Ordered:  Orders Placed This Encounter  Procedures   EKG 12-Lead   Meds ordered this encounter  Medications   ticagrelor (BRILINTA) 60 MG TABS tablet    Sig: Take 1 tablet (60 mg total) by mouth 2 (two) times daily.    Dispense:  180 tablet    Refill:  3    Dose decrease & stopping ASA   sacubitril-valsartan (ENTRESTO) 24-26 MG    Sig: TAKE (1) TABLET BY MOUTH TWICE DAILY.    Dispense:  180 tablet    Refill:  3   dapagliflozin propanediol (FARXIGA) 10 MG TABS tablet    Sig: Take 1 tablet (10 mg total) by mouth daily.    Dispense:  90 tablet    Refill:  3   carvedilol (COREG) 3.125 MG tablet    Sig: Take 1 tablet (3.125 mg total) by mouth 2 (two) times daily with a meal.    Dispense:  180 tablet    Refill:  3   atorvastatin (LIPITOR) 80 MG tablet    Sig: Take 1 tablet (80 mg total) by mouth daily.    Dispense:  90 tablet    Refill:  3    Patient Instructions / Medication Changes & Studies & Tests Ordered   Patient Instructions  Medication Instructions:  - Your physician has recommended you make the following change in your medication:   1) STOP Aspirin on Monday 08/10/22  2) DECREASE Brilinta to 60 mg: - take 1 tablet by mouth TWICE daily   All of your cardiac medications have been refilled at your pharmacy today.  *If you need a refill on your cardiac medications before your next appointment, please call your pharmacy*   Lab Work: - none ordered  If you have labs (blood work) drawn today and your tests are completely normal, you will receive your results only  by: [place] (if you have MyChart) OR A paper copy in the mail If you have any lab test that is abnormal or we need to change your treatment, we will call you to review the results.   Testing/Procedures: - none ordered   Follow-Up:  At [facility], you and your health needs are our priority.  As part of our continuing mission to provide you with exceptional heart care, we have created designated Provider Care Teams.  These Care Teams include your primary Cardiologist (physician) and Advanced Practice Providers (APPs -  Physician Assistants and Nurse Practitioners) who all work together to provide you with the care you need, when you need it.  We recommend signing up for the patient portal called "MyChart".  Sign up information is provided on this After Visit Summary.  MyChart is used to connect with patients for Virtual Visits (Telemedicine).  Patients are able to view lab/test results, encounter notes, upcoming appointments, etc.  Non-urgent messages can be sent to your provider as well.   To learn more about what you can do with MyChart, go to [email or web address].    Your next appointment:   6 month(s)  The format for your next appointment:   In Person  Provider:   Glenetta Hew, MD    Other Instructions N/a  Important Information About Sugar          Leonie Man, MD, MS Glenetta Hew, M.D., M.S. Interventional Cardiologist  [facility]   [address]; [place], [place]  [address] [phone]           Fax [phone]    Thank you for choosing [facility] in [place]!!

## 2022-08-06 ENCOUNTER — Ambulatory Visit (INDEPENDENT_AMBULATORY_CARE_PROVIDER_SITE_OTHER): Payer: PRIVATE HEALTH INSURANCE | Admitting: Family Medicine

## 2022-08-06 ENCOUNTER — Ambulatory Visit: Payer: PRIVATE HEALTH INSURANCE | Attending: Cardiology | Admitting: Cardiology

## 2022-08-06 ENCOUNTER — Encounter: Payer: Self-pay | Admitting: Cardiology

## 2022-08-06 ENCOUNTER — Ambulatory Visit: Payer: PRIVATE HEALTH INSURANCE | Admitting: Cardiology

## 2022-08-06 VITALS — BP 96/68 | HR 68 | Ht 70.0 in | Wt 203.2 lb

## 2022-08-06 DIAGNOSIS — Z9861 Coronary angioplasty status: Secondary | ICD-10-CM

## 2022-08-06 DIAGNOSIS — E785 Hyperlipidemia, unspecified: Secondary | ICD-10-CM

## 2022-08-06 DIAGNOSIS — S91001A Unspecified open wound, right ankle, initial encounter: Secondary | ICD-10-CM | POA: Insufficient documentation

## 2022-08-06 DIAGNOSIS — I2510 Atherosclerotic heart disease of native coronary artery without angina pectoris: Secondary | ICD-10-CM

## 2022-08-06 DIAGNOSIS — I2109 ST elevation (STEMI) myocardial infarction involving other coronary artery of anterior wall: Secondary | ICD-10-CM | POA: Diagnosis not present

## 2022-08-06 DIAGNOSIS — I5042 Chronic combined systolic (congestive) and diastolic (congestive) heart failure: Secondary | ICD-10-CM

## 2022-08-06 DIAGNOSIS — E1169 Type 2 diabetes mellitus with other specified complication: Secondary | ICD-10-CM

## 2022-08-06 DIAGNOSIS — I255 Ischemic cardiomyopathy: Secondary | ICD-10-CM | POA: Diagnosis not present

## 2022-08-06 DIAGNOSIS — S91001D Unspecified open wound, right ankle, subsequent encounter: Secondary | ICD-10-CM | POA: Diagnosis not present

## 2022-08-06 DIAGNOSIS — I252 Old myocardial infarction: Secondary | ICD-10-CM | POA: Insufficient documentation

## 2022-08-06 MED ORDER — ENTRESTO 24-26 MG PO TABS: ORAL_TABLET | ORAL | 3 refills | Status: DC

## 2022-08-06 MED ORDER — CARVEDILOL 3.125 MG PO TABS: 3.1250 mg | ORAL_TABLET | Freq: Two times a day (BID) | ORAL | 3 refills | Status: DC

## 2022-08-06 MED ORDER — ATORVASTATIN CALCIUM 80 MG PO TABS: 80.0000 mg | ORAL_TABLET | Freq: Every day | ORAL | 3 refills | Status: DC

## 2022-08-06 MED ORDER — TICAGRELOR 60 MG PO TABS: 60.0000 mg | ORAL_TABLET | Freq: Two times a day (BID) | ORAL | 3 refills | Status: DC

## 2022-08-06 MED ORDER — DAPAGLIFLOZIN PROPANEDIOL 10 MG PO TABS: 10.0000 mg | ORAL_TABLET | Freq: Every day | ORAL | 3 refills | Status: DC

## 2022-08-06 NOTE — Patient Instructions (Addendum)
Medication Instructions:  - Your physician has recommended you make the following change in your medication:   1) STOP Aspirin on Monday 08/10/22  2) DECREASE Brilinta to 60 mg: - take 1 tablet by mouth TWICE daily   All of your cardiac medications have been refilled at your pharmacy today.  *If you need a refill on your cardiac medications before your next appointment, please call your pharmacy*   Lab Work: - none ordered  If you have labs (blood work) drawn today and your tests are completely normal, you will receive your results only by: [place] (if you have MyChart) OR A paper copy in the mail If you have any lab test that is abnormal or we need to change your treatment, we will call you to review the results.   Testing/Procedures: - none ordered   Follow-Up: At [facility], you and your health needs are our priority.  As part of our continuing mission to provide you with exceptional heart care, we have created designated Provider Care Teams.  These Care Teams include your primary Cardiologist (physician) and Advanced Practice Providers (APPs -  Physician Assistants and Nurse Practitioners) who all work together to provide you with the care you need, when you need it.  We recommend signing up for the patient portal called "MyChart".  Sign up information is provided on this After Visit Summary.  MyChart is used to connect with patients for Virtual Visits (Telemedicine).  Patients are able to view lab/test results, encounter notes, upcoming appointments, etc.  Non-urgent messages can be sent to your provider as well.   To learn more about what you can do with MyChart, go to [email or web address].    Your next appointment:   6 month(s)  The format for your next appointment:   In Person  Provider:   Glenetta Hew, MD    Other Instructions N/a  Important Information About Sugar

## 2022-08-06 NOTE — Assessment & Plan Note (Signed)
Healing well.  Supportive care.

## 2022-08-06 NOTE — Progress Notes (Signed)
Subjective:  Patient ID: Jeff Mccarthy, male    DOB: September 05, 1966  Age: 56 y.o. MRN: [id number]  CC: Chief Complaint  Patient presents with   right inner ankle wound     Follow up has completed cipro and doxy + abx     HPI:  56 year old male presents for evaluation of the above.  Patient was recently at the [facility] and suffered a wound to the medial aspect of his right ankle.  He was seen here on 9/7 and was treated with Cipro and Doxy as well as topical Bactroban.  He presents today for reevaluation.  Redness has dramatically improved.  He now has a central eschar.  Wound appears to be healing well.  No fever.  No purulent drainage.  He is done with the oral antibiotics.  Patient Active Problem List   Diagnosis Date Noted   ST elevation myocardial infarction (STEMI) of anterior wall, subsequent episode of care ([place]) 08/06/2022   Wound of right ankle 08/06/2022   Ischemic Cardiomyopathy-Resolved 01/14/2022   Chronic combined systolic and diastolic heart failure ([place]) 01/14/2022   Hyperlipidemia associated with type 2 diabetes mellitus ([place]) 08/09/2021   CAD S/P DES PCI mid to distal LAD 08/09/2021   Type 2 diabetes mellitus with complication, without long-term current use of insulin ([place]) 04/20/2015    Social Hx   Social History   Socioeconomic History   Marital status: Married    Spouse name: Not on file   Number of children: Not on file   Years of education: Not on file   Highest education level: Not on file  Occupational History   Not on file  Tobacco Use   Smoking status: Former    Types: Cigarettes    Quit date: 11/22/2014    Years since quitting: 7.7   Smokeless tobacco: Never  Vaping Use   Vaping Use: Never used  Substance and Sexual Activity   Alcohol use: Yes   Drug use: Never   Sexual activity: Not on file  Other Topics Concern   Not on file  Social History Narrative   Not on file   Social Determinants of Health   Financial Resource Strain:  Not on file  Food Insecurity: Not on file  Transportation Needs: Not on file  Physical Activity: Not on file  Stress: Not on file  Social Connections: Not on file    Review of Systems Per HPI  Objective:  BP 105/71   Pulse 71   Temp (!) 97.2 F (36.2 C)   Ht '5\' 10"'$  (1.778 m)   Wt 205 lb (93 kg)   SpO2 98%   BMI 29.41 kg/m      08/06/2022    3:31 PM 08/06/2022    8:08 AM 07/30/2022    1:07 PM  BP/Weight  Systolic BP 174 96 081  Diastolic BP 71 68 79  Wt. (Lbs) 205 203.2 213.8  BMI 29.41 kg/m2 29.16 kg/m2 30.68 kg/m2    Physical Exam Vitals and nursing note reviewed.  Constitutional:      General: He is not in acute distress.    Appearance: Normal appearance.  HENT:     Head: Normocephalic and atraumatic.  Pulmonary:     Effort: Pulmonary effort is normal. No respiratory distress.  Skin:    Comments: Healing wound to the right medial malleolus.  Minimal surrounding erythema.  No purulent discharge.  No tenderness to palpation.  Neurological:     Mental Status: He is  alert.     Lab Results  Component Value Date   WBC 7.7 06/30/2022   HGB 17.0 06/30/2022   HCT 50.8 06/30/2022   PLT 213 06/30/2022   GLUCOSE 112 (H) 06/30/2022   CHOL 102 06/30/2022   TRIG 93 06/30/2022   HDL 42 06/30/2022   LDLCALC 42 06/30/2022   ALT 17 06/30/2022   AST 16 06/30/2022   NA 143 06/30/2022   K 4.3 06/30/2022   CL 101 06/30/2022   CREATININE 0.82 06/30/2022   BUN 16 06/30/2022   CO2 24 06/30/2022   HGBA1C 5.8 (H) 06/30/2022     Assessment & Plan:   Problem List Items Addressed This Visit       Other   Wound of right ankle    Healing well.  Supportive care.      [place]

## 2022-08-06 NOTE — Assessment & Plan Note (Signed)
Labs just checked in August to have LDL of 42 and A1c of 5.8.  Outstanding. He is very active, monitoring his diet and also on good combination medications.  Plan:   Continue 80 mg Lipitor for now,   if he does have LDL that is still below 50 on this high dose, would consider potentially reducing to 40 mg dose to avoid adverse reactions.  On combination low-dose metformin along with Wilder Glade which has combined CHF and diabetes effect.

## 2022-08-06 NOTE — Assessment & Plan Note (Signed)
1 year out from STEMI.  Recovered from ischemic cardiomyopathy.  No active angina.  On stable regimen.  This definitely scared him and he has been making a concerted effort to stay active and healthy and losing weight.  Recovering well.

## 2022-08-06 NOTE — Assessment & Plan Note (Signed)
Thankfully, his EF improved back to her normal baseline EF.  There is some apical wall motion abnormality consistent with prior MI, but NYHA Class I symptoms with no real heart failure to speak of.  Maybe some mild minimal fatigue and exercise dyspnea as well as edema.  Appropriate otherwise pretty much euvolemic and doing well.  Not requiring diuretic.  On appropriate meds including carvedilol and Entresto (not able to titrate further because of low blood pressures. Is also on Farxiga and not requiring diuretic.

## 2022-08-06 NOTE — Assessment & Plan Note (Addendum)
Doing remarkably well.  Has had no major issues since PCI.  No angina or heart failure symptoms.  He does have some bruising on combination of aspirin and Plavix.  He is now 1 year out from his PCI and doing well.  Plan:   Continue current dose of carvedilol, Lipitor and Entresto along with [place].  Convert from treatment to maintenance dose of Brilinta (90 mg to 60 mg), and stop aspirin. => Okay to interrupt Brilinta as of 08/09/2022..  As of 08/09/2022, okay to hold Brilinta 5 to 7 days preop for surgeries or procedures.    5 days for minor procedures, 7 days from injury) restart within 2 days.  First dose (restarting would be 2 tablets)

## 2022-08-06 NOTE — Assessment & Plan Note (Signed)
Presents.  No active heart failure symptoms.  NYHA Class I. Euvolemic.  Is on stable dose of carvedilol and Entresto, blood pressure below 100, no room to titrate further. Diuretic.  Only on Farxiga.

## 2022-08-25 LAB — HM DIABETES EYE EXAM

## 2022-11-03 ENCOUNTER — Other Ambulatory Visit: Payer: Self-pay | Admitting: Family Medicine

## 2022-12-07 ENCOUNTER — Ambulatory Visit: Payer: PRIVATE HEALTH INSURANCE

## 2022-12-07 ENCOUNTER — Ambulatory Visit: Payer: PRIVATE HEALTH INSURANCE | Admitting: Family Medicine

## 2022-12-07 VITALS — BP 110/78 | HR 83 | Temp 98.1°F | Ht 70.0 in | Wt 217.0 lb

## 2022-12-07 DIAGNOSIS — J209 Acute bronchitis, unspecified: Secondary | ICD-10-CM | POA: Diagnosis not present

## 2022-12-07 DIAGNOSIS — R051 Acute cough: Secondary | ICD-10-CM

## 2022-12-07 MED ORDER — PROMETHAZINE-DM 6.25-15 MG/5ML PO SYRP: 5.0000 mL | ORAL_SOLUTION | Freq: Four times a day (QID) | ORAL | 0 refills | Status: DC | PRN

## 2022-12-07 MED ORDER — METHYLPREDNISOLONE ACETATE 40 MG/ML IJ SUSP: 40.0000 mg | Freq: Once | INTRAMUSCULAR | Status: DC

## 2022-12-07 MED ORDER — METHYLPREDNISOLONE SODIUM SUCC 40 MG IJ SOLR
40.0000 mg | Freq: Once | INTRAMUSCULAR | Status: AC
  Administered 2022-12-07: 40 mg via INTRAMUSCULAR

## 2022-12-07 MED ORDER — DOXYCYCLINE HYCLATE 100 MG PO TABS: 100.0000 mg | ORAL_TABLET | Freq: Two times a day (BID) | ORAL | 0 refills | Status: DC

## 2022-12-07 NOTE — Assessment & Plan Note (Addendum)
Discussed testing for flu and COVID.  We elected to not proceed with this due to the fact that by the time the results return he will be outside of the window for COVID-19 treatment and is already outside the window for flu treatment in the event that either 1 would be positive.  Treating empirically for bacterial bronchitis with doxycycline.  Given wheezing, corticosteroid injection given today.  Promethazine DM for cough.

## 2022-12-07 NOTE — Progress Notes (Signed)
Subjective:  Patient ID: Jeff Mccarthy, male    DOB: 05/22/1966  Age: 57 y.o. MRN: [id number]  CC: Chief Complaint  Patient presents with   Cough    Congestion- non productive, nasal congestion and pressure, diarrhea     HPI:  57 year old male presents for evaluation of the above.  Patient reports that he has been sick since Thursday.  He said harsh cough which is dry.  Associated rib discomfort.  Had diarrhea over the weekend.  Has significant congestion and sinus pain/pressure as well.  He has been using over-the-counter medications without resolution.  No fever.  Has had recent sick contacts.  Patient Active Problem List   Diagnosis Date Noted   Acute bronchitis 12/07/2022   Ischemic Cardiomyopathy-Resolved 01/14/2022   Chronic combined systolic and diastolic heart failure ([place]) 01/14/2022   Hyperlipidemia associated with type 2 diabetes mellitus ([place]) 08/09/2021   CAD S/P DES PCI mid to distal LAD 08/09/2021   Type 2 diabetes mellitus with complication, without long-term current use of insulin ([place]) 04/20/2015    Social Hx   Social History   Socioeconomic History   Marital status: Married    Spouse name: Not on file   Number of children: Not on file   Years of education: Not on file   Highest education level: Not on file  Occupational History   Not on file  Tobacco Use   Smoking status: Former    Types: Cigarettes    Quit date: 11/22/2014    Years since quitting: 8.0   Smokeless tobacco: Never  Vaping Use   Vaping Use: Never used  Substance and Sexual Activity   Alcohol use: Yes   Drug use: Never   Sexual activity: Not on file  Other Topics Concern   Not on file  Social History Narrative   Not on file   Social Determinants of Health   Financial Resource Strain: Not on file  Food Insecurity: Not on file  Transportation Needs: Not on file  Physical Activity: Not on file  Stress: Not on file  Social Connections: Not on file    Review of Systems Per  HPI  Objective:  BP 110/78   Pulse 83   Temp 98.1 F (36.7 C)   Ht '5\' 10"'$  (1.778 m)   Wt 217 lb (98.4 kg)   SpO2 97%   BMI 31.14 kg/m      12/07/2022    3:22 PM 08/06/2022    3:31 PM 08/06/2022    8:08 AM  BP/Weight  Systolic BP 144 818 96  Diastolic BP 78 71 68  Wt. (Lbs) 217 205 203.2  BMI 31.14 kg/m2 29.41 kg/m2 29.16 kg/m2    Physical Exam Vitals and nursing note reviewed.  Constitutional:      General: He is not in acute distress.    Appearance: Normal appearance.  HENT:     Head: Normocephalic and atraumatic.     Right Ear: Tympanic membrane normal.     Left Ear: Tympanic membrane normal.     Mouth/Throat:     Pharynx: Oropharynx is clear.  Eyes:     General:        Right eye: No discharge.        Left eye: No discharge.     Conjunctiva/sclera: Conjunctivae normal.  Cardiovascular:     Rate and Rhythm: Normal rate and regular rhythm.  Pulmonary:     Effort: Pulmonary effort is normal.     Comments: Diffuse expiratory wheezing.  Neurological:     Mental Status: He is alert.  Psychiatric:        Mood and Affect: Mood normal.        Behavior: Behavior normal.     Lab Results  Component Value Date   WBC 7.7 06/30/2022   HGB 17.0 06/30/2022   HCT 50.8 06/30/2022   PLT 213 06/30/2022   GLUCOSE 112 (H) 06/30/2022   CHOL 102 06/30/2022   TRIG 93 06/30/2022   HDL 42 06/30/2022   LDLCALC 42 06/30/2022   ALT 17 06/30/2022   AST 16 06/30/2022   NA 143 06/30/2022   K 4.3 06/30/2022   CL 101 06/30/2022   CREATININE 0.82 06/30/2022   BUN 16 06/30/2022   CO2 24 06/30/2022   HGBA1C 5.8 (H) 06/30/2022     Assessment & Plan:   Problem List Items Addressed This Visit       Respiratory   Acute bronchitis    Discussed testing for flu and COVID.  We elected to not proceed with this due to the fact that by the time the results return he will be outside of the window for COVID-19 treatment and is already outside the window for flu treatment in the event  that either 1 would be positive.  Treating empirically for bacterial bronchitis with doxycycline.  Given wheezing, corticosteroid injection given today.  Promethazine DM for cough.      Other Visit Diagnoses     Acute cough    -  Primary   Relevant Medications   methylPREDNISolone sodium succinate (SOLU-MEDROL) 40 mg/mL injection 40 mg (Completed)       Meds ordered this encounter  Medications   doxycycline (VIBRA-TABS) 100 MG tablet    Sig: Take 1 tablet (100 mg total) by mouth 2 (two) times daily.    Dispense:  14 tablet    Refill:  0   promethazine-dextromethorphan (PROMETHAZINE-DM) 6.25-15 MG/5ML syrup    Sig: Take 5 mLs by mouth 4 (four) times daily as needed for cough.    Dispense:  118 mL    Refill:  0   DISCONTD: methylPREDNISolone acetate (DEPO-MEDROL) injection 40 mg   methylPREDNISolone sodium succinate (SOLU-MEDROL) 40 mg/mL injection 40 mg    Follow-up:  Return if symptoms worsen or fail to improve.  [place]

## 2022-12-31 ENCOUNTER — Ambulatory Visit: Payer: PRIVATE HEALTH INSURANCE | Admitting: Family Medicine

## 2023-01-12 ENCOUNTER — Ambulatory Visit: Payer: PRIVATE HEALTH INSURANCE | Admitting: Family Medicine

## 2023-01-12 DIAGNOSIS — I5042 Chronic combined systolic (congestive) and diastolic (congestive) heart failure: Secondary | ICD-10-CM

## 2023-01-12 DIAGNOSIS — Z125 Encounter for screening for malignant neoplasm of prostate: Secondary | ICD-10-CM | POA: Diagnosis not present

## 2023-01-12 DIAGNOSIS — E118 Type 2 diabetes mellitus with unspecified complications: Secondary | ICD-10-CM

## 2023-01-12 DIAGNOSIS — Z9861 Coronary angioplasty status: Secondary | ICD-10-CM

## 2023-01-12 DIAGNOSIS — E785 Hyperlipidemia, unspecified: Secondary | ICD-10-CM

## 2023-01-12 DIAGNOSIS — Z130 Encounter for screening for diseases of the blood and blood-forming organs and certain disorders involving the immune mechanism: Secondary | ICD-10-CM

## 2023-01-12 DIAGNOSIS — E1169 Type 2 diabetes mellitus with other specified complication: Secondary | ICD-10-CM

## 2023-01-12 DIAGNOSIS — I2510 Atherosclerotic heart disease of native coronary artery without angina pectoris: Secondary | ICD-10-CM

## 2023-01-12 MED ORDER — METFORMIN HCL 500 MG PO TABS: ORAL_TABLET | ORAL | 1 refills | Status: DC

## 2023-01-12 NOTE — Progress Notes (Signed)
Subjective:  Patient ID: Jeff Mccarthy, male    DOB: 1966/01/04  Age: 57 y.o. MRN: [id number]  CC: Chief Complaint  Patient presents with   Diabetes    HPI:  56 year old male with the below mentioned past medical history presents for follow-up.  Patient is doing well from a cardiac perspective.  No chest pain or shortness of breath.  Remains on Brilinta, Entresto, Farxiga, carvedilol, and atorvastatin.  Type 2 diabetes has been well-controlled on metformin and Farxiga.  Needs labs.  Hyperlipidemia at goal on Lipitor.  No reported adverse side effects.   Patient Active Problem List   Diagnosis Date Noted   Chronic combined systolic and diastolic heart failure ([place]) 01/14/2022   Hyperlipidemia associated with type 2 diabetes mellitus ([place]) 08/09/2021   CAD S/P DES PCI mid to distal LAD 08/09/2021   Type 2 diabetes mellitus with complication, without long-term current use of insulin ([place]) 04/20/2015    Social Hx   Social History   Socioeconomic History   Marital status: Married    Spouse name: Not on file   Number of children: Not on file   Years of education: Not on file   Highest education level: Not on file  Occupational History   Not on file  Tobacco Use   Smoking status: Former    Types: Cigarettes    Quit date: 11/22/2014    Years since quitting: 8.1   Smokeless tobacco: Never  Vaping Use   Vaping Use: Never used  Substance and Sexual Activity   Alcohol use: Yes   Drug use: Never   Sexual activity: Not on file  Other Topics Concern   Not on file  Social History Narrative   Not on file   Social Determinants of Health   Financial Resource Strain: Not on file  Food Insecurity: Not on file  Transportation Needs: Not on file  Physical Activity: Not on file  Stress: Not on file  Social Connections: Not on file    Review of Systems Per HPI  Objective:  BP 111/74   Pulse 74   Temp (!) 97.2 F (36.2 C)   Ht 5' 10"$  (1.778 m)   Wt 217 lb (98.4 kg)    SpO2 97%   BMI 31.14 kg/m      01/12/2023    9:41 AM 12/07/2022    3:22 PM 08/06/2022    3:31 PM  BP/Weight  Systolic BP [id number]  Diastolic BP 74 78 71  Wt. (Lbs) 217 217 205  BMI 31.14 kg/m2 31.14 kg/m2 29.41 kg/m2    Physical Exam Vitals and nursing note reviewed.  Constitutional:      General: He is not in acute distress.    Appearance: Normal appearance.  HENT:     Head: Normocephalic and atraumatic.  Eyes:     General:        Right eye: No discharge.        Left eye: No discharge.     Conjunctiva/sclera: Conjunctivae normal.  Cardiovascular:     Rate and Rhythm: Normal rate and regular rhythm.  Pulmonary:     Effort: Pulmonary effort is normal.     Breath sounds: Normal breath sounds. No wheezing, rhonchi or rales.  Neurological:     Mental Status: He is alert.  Psychiatric:        Mood and Affect: Mood normal.        Behavior: Behavior normal.     Lab Results  Component Value  Date   WBC 7.7 06/30/2022   HGB 17.0 06/30/2022   HCT 50.8 06/30/2022   PLT 213 06/30/2022   GLUCOSE 112 (H) 06/30/2022   CHOL 102 06/30/2022   TRIG 93 06/30/2022   HDL 42 06/30/2022   LDLCALC 42 06/30/2022   ALT 17 06/30/2022   AST 16 06/30/2022   NA 143 06/30/2022   K 4.3 06/30/2022   CL 101 06/30/2022   CREATININE 0.82 06/30/2022   BUN 16 06/30/2022   CO2 24 06/30/2022   HGBA1C 5.8 (H) 06/30/2022     Assessment & Plan:   Problem List Items Addressed This Visit       Cardiovascular and Mediastinum   Chronic combined systolic and diastolic heart failure (HCC) (Chronic)    Now resolved.  Continue current medications.      CAD S/P DES PCI mid to distal LAD (Chronic)    Stable.  Continue current medications.        Endocrine   Type 2 diabetes mellitus with complication, without long-term current use of insulin (HCC) (Chronic)    A1c today.  Continue metformin and Farxiga.      Relevant Medications   metFORMIN (GLUCOPHAGE) 500 MG tablet   Other Relevant  Orders   CMP14+EGFR   Hemoglobin A1c   Microalbumin / creatinine urine ratio   Hyperlipidemia associated with type 2 diabetes mellitus ([place]) (Chronic)    Well-controlled.  Continue Lipitor.      Relevant Medications   metFORMIN (GLUCOPHAGE) 500 MG tablet   Other Relevant Orders   Lipid panel   Other Visit Diagnoses     Screening for deficiency anemia       Relevant Orders   CBC   Prostate cancer screening       Relevant Orders   PSA       Meds ordered this encounter  Medications   metFORMIN (GLUCOPHAGE) 500 MG tablet    Sig: TAKE (1) TABLET BY MOUTH TWICE A DAY WITH MEALS.    Dispense:  180 tablet    Refill:  1    Follow-up:  Return in about 6 months (around 07/13/2023).  [place]

## 2023-01-12 NOTE — Patient Instructions (Signed)
Labs when is convenient.  Continue medications.  Follow-up in 6 months.

## 2023-01-12 NOTE — Assessment & Plan Note (Signed)
Well-controlled.  Continue Lipitor.

## 2023-01-12 NOTE — Assessment & Plan Note (Signed)
A1c today.  Continue metformin and Farxiga.

## 2023-01-12 NOTE — Assessment & Plan Note (Signed)
Now resolved.  Continue current medications.

## 2023-01-12 NOTE — Assessment & Plan Note (Signed)
Stable.  Continue current medications.

## 2023-01-13 LAB — CBC
Hematocrit: 46.4 % (ref 37.5–51.0)
Hemoglobin: 16.5 g/dL (ref 13.0–17.7)
MCH: 32.4 pg (ref 26.6–33.0)
MCHC: 35.6 g/dL (ref 31.5–35.7)
MCV: 91 fL (ref 79–97)
Platelets: 173 10*3/uL (ref 150–450)
RBC: 5.1 x10E6/uL (ref 4.14–5.80)
RDW: 12.3 % (ref 11.6–15.4)
WBC: 6.7 10*3/uL (ref 3.4–10.8)

## 2023-01-13 LAB — MICROALBUMIN / CREATININE URINE RATIO
Creatinine, Urine: 67.9 mg/dL
Microalb/Creat Ratio: 10 mg/g creat (ref 0–29)
Microalbumin, Urine: 7 ug/mL

## 2023-01-13 LAB — CMP14+EGFR
ALT: 22 IU/L (ref 0–44)
AST: 16 IU/L (ref 0–40)
Albumin/Globulin Ratio: 2.4 — ABNORMAL HIGH (ref 1.2–2.2)
Albumin: 5 g/dL — ABNORMAL HIGH (ref 3.8–4.9)
Alkaline Phosphatase: 51 IU/L (ref 44–121)
BUN/Creatinine Ratio: 13 (ref 9–20)
BUN: 12 mg/dL (ref 6–24)
Bilirubin Total: 1 mg/dL (ref 0.0–1.2)
CO2: 24 mmol/L (ref 20–29)
Calcium: 9.6 mg/dL (ref 8.7–10.2)
Chloride: 101 mmol/L (ref 96–106)
Creatinine, Ser: 0.89 mg/dL (ref 0.76–1.27)
Globulin, Total: 2.1 g/dL (ref 1.5–4.5)
Glucose: 127 mg/dL — ABNORMAL HIGH (ref 70–99)
Potassium: 4.4 mmol/L (ref 3.5–5.2)
Sodium: 141 mmol/L (ref 134–144)
Total Protein: 7.1 g/dL (ref 6.0–8.5)
eGFR: 101 mL/min/{1.73_m2} (ref >59)

## 2023-01-13 LAB — PSA: Prostate Specific Ag, Serum: 0.5 ng/mL (ref 0.0–4.0)

## 2023-01-13 LAB — HEMOGLOBIN A1C
Est. average glucose Bld gHb Est-mCnc: 131 mg/dL
Hgb A1c MFr Bld: 6.2 % — ABNORMAL HIGH (ref 4.8–5.6)

## 2023-01-13 LAB — LIPID PANEL
Chol/HDL Ratio: 2.4 ratio (ref 0.0–5.0)
Cholesterol, Total: 112 mg/dL (ref 100–199)
HDL: 47 mg/dL (ref >39)
LDL Chol Calc (NIH): 49 mg/dL (ref 0–99)
Triglycerides: 83 mg/dL (ref 0–149)
VLDL Cholesterol Cal: 16 mg/dL (ref 5–40)

## 2023-02-12 ENCOUNTER — Ambulatory Visit: Payer: PRIVATE HEALTH INSURANCE | Attending: Cardiology | Admitting: Cardiology

## 2023-02-12 ENCOUNTER — Encounter: Payer: Self-pay | Admitting: Cardiology

## 2023-02-12 VITALS — BP 118/82 | HR 76 | Ht 70.0 in | Wt 210.0 lb

## 2023-02-12 DIAGNOSIS — E1169 Type 2 diabetes mellitus with other specified complication: Secondary | ICD-10-CM

## 2023-02-12 DIAGNOSIS — Z9861 Coronary angioplasty status: Secondary | ICD-10-CM | POA: Diagnosis not present

## 2023-02-12 DIAGNOSIS — E785 Hyperlipidemia, unspecified: Secondary | ICD-10-CM | POA: Diagnosis not present

## 2023-02-12 DIAGNOSIS — I5042 Chronic combined systolic (congestive) and diastolic (congestive) heart failure: Secondary | ICD-10-CM

## 2023-02-12 DIAGNOSIS — I2510 Atherosclerotic heart disease of native coronary artery without angina pectoris: Secondary | ICD-10-CM

## 2023-02-12 MED ORDER — ATORVASTATIN CALCIUM 40 MG PO TABS: 40.0000 mg | ORAL_TABLET | Freq: Every day | ORAL | 3 refills | Status: DC

## 2023-02-12 NOTE — Patient Instructions (Addendum)
Medication Instructions:   Decrease Atorvastatin to 40 mg daily at bedtime   A new prescription has been sent for the 40 mg tablet    *If you need a refill on your cardiac medications before your next appointment, please call your pharmacy*   Lab Work: Not needed    Testing/Procedures:  Not needed  Follow-Up: At [facility], you and your health needs are our priority.  As part of our continuing mission to provide you with exceptional heart care, we have created designated Provider Care Teams.  These Care Teams include your primary Cardiologist (physician) and Advanced Practice Providers (APPs -  Physician Assistants and Nurse Practitioners) who all work together to provide you with the care you need, when you need it.     Your next appointment:   6 month(s)  The format for your next appointment:   In Person  Provider:   Glenetta Hew, MD

## 2023-02-12 NOTE — Assessment & Plan Note (Signed)
Lipid panel 01/12/2023 with LDL 49. Will decrease Atorvastatin from 80 to 40 mg. Return in 6 months.

## 2023-02-12 NOTE — Progress Notes (Unsigned)
.  Primary Care Provider: Coral Spikes, [place] Cardiologist: Glenetta Hew, MD Electrophysiologist: None  Clinic Note: No chief complaint on file.   ===================================  ASSESSMENT/PLAN   Problem List Items Addressed This Visit       Cardiovascular and Mediastinum   CAD S/P DES PCI mid to distal LAD - Primary (Chronic)    Doing very well and asymptomatic with no anginal pain. Will continue current doses of Carvedilol, [place] Gl[place], [place], and Brilinta. Decrease Atorvastatin to 40 mg. F/u in 6 months.       Relevant Medications   sacubitril-valsartan (ENTRESTO) 24-26 MG   atorvastatin (LIPITOR) 40 MG tablet   Other Relevant Orders   EKG 12-Lead   Chronic combined systolic and diastolic heart failure (HCC) (Chronic)    EF improved to normal s/p MI. He is currently asymptomatic, no need for repeat echo, no need for diuretics.       Relevant Medications   sacubitril-valsartan (ENTRESTO) 24-26 MG   atorvastatin (LIPITOR) 40 MG tablet     Other   Hyperlipidemia    Lipid panel 01/12/2023 with LDL 49. Will decrease Atorvastatin from 80 to 40 mg. Return in 6 months.       Relevant Medications   sacubitril-valsartan (ENTRESTO) 24-26 MG   atorvastatin (LIPITOR) 40 MG tablet   Other Visit Diagnoses     Coronary artery disease involving native coronary artery of native heart without angina pectoris       Relevant Medications   sacubitril-valsartan (ENTRESTO) 24-26 MG   atorvastatin (LIPITOR) 40 MG tablet   Other Relevant Orders   EKG 12-Lead       ===================================  HPI:    Jeff Mccarthy is a 57 y.o. male with a PMH below who presents today for 53-month follow-up at the request of Coral Spikes, DO.   08/09/2021: 1 V CAD; -> Anterior STEMI:  p LAD 30=> p-mLAD 60%=> mLAD (after D2) 100%-70% => 2 overlapping DES PCI Onyx Frontier prox-mid LAD (3.0 x 18 and 3.0 x 26-tapered postdilation 4.1-3.3 mm. Ischemic CM - resolved:  => Post MI EF 35 to 40% with distal inferior, mid to distal inferoseptal and apical wall hypokinesis to akinesis. 10/2021: EF 55 to 60%.  Mid to apical septal and apical wall hypokinetic.  Septal hypertrophy.  GR 1 DD.   Jeff Mccarthy was seen on 08/06/2022 by Dr. Ellyn Hack and doing overall well. Only complaints were mild orthostatic dizziness and some bruising. At that visit, aspirin was stopped and Brilinta decreased from 90 to 60 mg.    Recent Hospitalizations: None  Reviewed  CV studies:    The following studies were reviewed today: (if available, images/films reviewed: From Epic Chart or Care Everywhere) Catheter and PCI 08/10/2021 TTE 08/11/2021 and 11/03/2021  Interval History:   Jeff Mccarthy  returns for follow up and doing well. Recently got a great dane which is prompting him to stay active. He denies SOB, CP, fatigue, states he has been feeling great. Denies palpitations or fast HR. Denies orthopnea and LE edema. Still taking Brilinta which was decreased at last visit d/t bruising. Today he denies bruising, no blood in stool or urine, no epistaxis.  He is still walking at least 2 miles 4-5 days/week.      REVIEWED OF SYSTEMS   ROS  I have reviewed and (if needed) personally updated the patient's problem list, medications, allergies, past medical and surgical history, social and family history.   PAST MEDICAL HISTORY  Past Medical History:  Diagnosis Date   CAD S/P DES PCI mid to distal LAD 08/09/2021   Anterior STEMI =-PCI : p-m LAD 60% stenosis, 30% stenosis D2 and mid LAD lesion 100% stenosis just distal to D2.  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  Initial EF was 35 to 40%.  Follow-up EF 55 to 30% -> on  low-dose carvedilol and Entresto along with Farxiga   ST elevation (STEMI) myocardial infarction involving left anterior descending coronary artery ([place]) 08/09/2021   Mid to distal LAD occlusion-DES PCI with 2 overlapping stents:  Onyx Frontier DES 3 x 18 & 3 x 26 (tapered 4.1-3.20mm).  Jailed D2 with 60% ostial reduced to 30%.    PAST SURGICAL HISTORY   Past Surgical History:  Procedure Laterality Date   COLONOSCOPY     COLONOSCOPY N/A 07/08/2018   Procedure: COLONOSCOPY;  Surgeon: Daneil Dolin, MD;  [facility];  Service: Endoscopy;  Laterality: N/A;  12:15   COLONOSCOPY N/A 07/13/2018   Procedure: COLONOSCOPY;  Surgeon: Daneil Dolin, MD;  [facility];  Service: Endoscopy;  Laterality: N/A;   CORONARY STENT INTERVENTION N/A 08/09/2021   Procedure: CORONARY STENT INTERVENTION;  Surgeon: Leonie Man, MD;  Location: [place] [facility];  Service: Cardiovascular;  Laterality: N/A;   CORONARY/GRAFT ACUTE MI REVASCULARIZATION N/A 08/09/2021   Procedure: Coronary/Graft Acute MI Revascularization;  Surgeon: Leonie Man, MD;  Location: [place];  Service: Cardiovascular;  Laterality: N/A;   LEFT HEART CATH AND CORONARY ANGIOGRAPHY N/A 08/09/2021   Procedure: LEFT HEART CATH AND CORONARY ANGIOGRAPHY;  Surgeon: Leonie Man, MD;  Location: [facility];  Service: Cardiovascular;  Laterality: N/A;   NECK SURGERY     C5, C6   POLYPECTOMY     POLYPECTOMY  07/08/2018   Procedure: POLYPECTOMY;  Surgeon: Daneil Dolin, MD;  [facility];  Service: Endoscopy;;  cecal, hepatic flexure,    Immunization History  Administered Date(s) Administered   Influenza-Unspecified 08/23/2014, 08/28/2015   Moderna Sars-Covid-2 Vaccination 12/19/2019, 01/19/2020   Tdap 11/06/2015    MEDICATIONS/ALLERGIES   Current Meds  Medication Sig   atorvastatin (LIPITOR) 40 MG tablet Take 1 tablet (40 mg total) by mouth daily.   carvedilol (COREG) 3.125 MG  tablet Take 1 tablet (3.125 mg total) by mouth 2 (two) times daily with a meal.   dapagliflozin propanediol (FARXIGA) 10 MG TABS tablet Take 1 tablet (10 mg total) by mouth daily.   metFORMIN (GLUCOPHAGE) 500 MG tablet TAKE (1) TABLET BY MOUTH TWICE A DAY WITH MEALS.   sacubitril-valsartan (ENTRESTO) 24-26 MG Take 1 tablet by mouth 2 (two) times daily.   ticagrelor (BRILINTA) 60 MG TABS tablet Take 1 tablet (60 mg total) by mouth 2 (two) times daily.   [DISCONTINUED] atorvastatin (LIPITOR) 80 MG tablet Take 1 tablet (80 mg total) by mouth daily.    Allergies  Allergen Reactions   Biaxin [Clarithromycin] Other (See Comments)    GI upset    SOCIAL HISTORY/FAMILY HISTORY   Reviewed in Epic:  Pertinent findings:  Social History   Tobacco Use   Smoking status: Former    Types: Cigarettes    Quit  date: 11/22/2014    Years since quitting: 8.2   Smokeless tobacco: Never  Vaping Use   Vaping Use: Never used  Substance Use Topics   Alcohol use: Yes   Drug use: Never   Social History   Social History Narrative   Not on file    OBJCTIVE -PE, EKG, labs   Wt Readings from Last 3 Encounters:  02/12/23 210 lb (95.3 kg)  01/12/23 217 lb (98.4 kg)  12/07/22 217 lb (98.4 kg)    Physical Exam: BP 118/82   Pulse 76   Ht 5\' 10"  (1.778 m)   Wt 210 lb (95.3 kg)   SpO2 95%   BMI 30.13 kg/m  Physical Exam  General: NAD, pleasant, able to participate in exam Cardiac: RRR, no murmurs. Respiratory: CTAB, normal effort, No wheezes, rales or rhonchi Abdomen: Bowel sounds present, nontender, nondistended, soft Extremities: no edema of BLEs Skin: warm and dry, no rashes noted Neuro: alert, no obvious focal deficits Psych: Normal affect and mood   Adult ECG Report  Rate: 76 ;  Rhythm: normal sinus rhythm;   Recent Labs:    Lab Results  Component Value Date   CHOL 112 01/12/2023   HDL 47 01/12/2023   LDLCALC 49 01/12/2023   TRIG 83 01/12/2023   CHOLHDL 2.4 01/12/2023   Lab  Results  Component Value Date   CREATININE 0.89 01/12/2023   BUN 12 01/12/2023   NA 141 01/12/2023   K 4.4 01/12/2023   CL 101 01/12/2023   CO2 24 01/12/2023      Latest Ref Rng & Units 01/12/2023   10:27 AM 06/30/2022    9:53 AM 08/11/2021    3:13 AM  CBC  WBC 3.4 - 10.8 x10E3/uL 6.7  7.7  9.8   Hemoglobin 13.0 - 17.7 g/dL 16.5  17.0  17.0   Hematocrit 37.5 - 51.0 % 46.4  50.8  48.7   Platelets 150 - 450 x10E3/uL 173  213  187     Lab Results  Component Value Date   HGBA1C 6.2 (H) 01/12/2023   No results found for: "TSH"  ================================================== I spent a total of ***minutes with the patient spent in direct patient consultation.  Additional time spent with chart review  / charting (studies, outside notes, etc): *** min Total Time: *** min  Current medicines are reviewed at length with the patient today.  (+/- concerns) ***  Notice: This dictation was prepared with Dragon dictation along with smart phrase technology. Any transcriptional errors that result from this process are unintentional and may not be corrected upon review.  Studies Ordered:   Orders Placed This Encounter  Procedures   EKG 12-Lead   Meds ordered this encounter  Medications   atorvastatin (LIPITOR) 40 MG tablet    Sig: Take 1 tablet (40 mg total) by mouth daily.    Dispense:  90 tablet    Refill:  3    Discontinue  80 mg dose prescription    Patient Instructions / Medication Changes & Studies & Tests Ordered   Patient Instructions  Medication Instructions:   Decrease Atorvastatin to 40 mg daily at bedtime   A new prescription has been sent for the 40 mg tablet    *If you need a refill on your cardiac medications before your next appointment, please call your pharmacy*   Lab Work: Not needed    Testing/Procedures:  Not needed  Follow-Up: At [facility], you and your health needs are our priority.  As part of our continuing mission to provide you with  exceptional heart care, we have created designated Provider Care Teams.  These Care Teams include your primary Cardiologist (physician) and Advanced Practice Providers (APPs -  Physician Assistants and Nurse Practitioners) who all work together to provide you with the care you need, when you need it.     Your next appointment:   6 month(s)  The format for your next appointment:   In Person  Provider:   Glenetta Hew, MD        @[email or web address] @   Thank you for choosing [facility] at [facility]!!   [email or web address] @

## 2023-02-12 NOTE — Assessment & Plan Note (Signed)
EF improved to normal s/p MI. He is currently asymptomatic, no need for repeat echo, no need for diuretics.

## 2023-02-12 NOTE — Assessment & Plan Note (Addendum)
Doing very well and asymptomatic with no anginal pain. Will continue current doses of Carvedilol, Wilder Glade, [place], and Brilinta. Decrease Atorvastatin to 40 mg. F/u in 6 months.    Okay to hold Brilinta 5 to 7 days preop for surgeries or procedures.

## 2023-02-13 ENCOUNTER — Encounter: Payer: Self-pay | Admitting: Cardiology

## 2023-02-13 DIAGNOSIS — I2510 Atherosclerotic heart disease of native coronary artery without angina pectoris: Secondary | ICD-10-CM | POA: Insufficient documentation

## 2023-02-13 NOTE — Assessment & Plan Note (Signed)
Lipid panel 01/12/2023 with LDL 49. Will decrease Atorvastatin from 80 to 40 mg.   Okay for 68-month follow-up  Previous diagnosed diabetes, A1c down to 6.2 likely related to weight loss.Jannetta Quint.

## 2023-02-13 NOTE — Progress Notes (Signed)
ATTENDING ATTESTATION  I have seen, examined and evaluated the patient along with the Resident Physician in clinic today.  I personally performed my own interview & exanimation.  After reviewing all the available data and chart, we discussed the patients laboratory, study & physical findings as well as symptoms in detail. I agree with her findings, examination as well as impression recommendations as per our discussion.    Attending adjustments int the full clinic noted annotated in [place].   Jeff Mccarthy presents for 54-month follow-up of CAD with anterior MI.  EF improved back to normal after PCI medical therapy.  He is unremarkably well with increased weight loss.  Less bruising on low-dose of Brilinta no aspirin.  No active angina or heart failure symptoms.  Exam is pretty much benign but notable for weight loss.  Lipid panel looks great, EKG reviewed.  Plan reduce atorvastatin dose down to 40 mg and continue to follow-up. Continue current doses of beta-blocker, Entresto and Farxiga. Continue Brilinta 60 mg twice daily, okay to hold for procedures or surgeries (5-days)   Will see him back in 6 months continue anniversary of his MI I have been probably after that we will do annual visits.  I congratulated for efforts weight loss.  Happy to hear that he is healthier now than he was prior to his MI.    Leonie Man, MD, MS Jeff Mccarthy, M.D., M.S. Interventional Cardiologist  [place]  Pager # [phone] Phone # [phone] [address] [place], [place] [address]

## 2023-02-13 NOTE — Assessment & Plan Note (Signed)
Doing well with no active angina symptoms.  Status post extensive LAD PCI jailing one of the diagonal branches with TIMI-3 flow restored.  EF after PCI and medical management, improved back to baseline. Plan Continue maintenance dose of Brilinta 60 mg twice daily at least to complete 2 years post MI Continue current dose of carvedilol 3.25 mg daily along with Entresto 24-26 mg daily and Farxiga 10 mg. Continue statin but reduce atorvastatin dose to 40 mg.

## 2023-02-19 ENCOUNTER — Encounter: Payer: Self-pay | Admitting: *Deleted

## 2023-05-06 ENCOUNTER — Telehealth: Payer: PRIVATE HEALTH INSURANCE | Admitting: Family Medicine

## 2023-05-06 DIAGNOSIS — B9689 Other specified bacterial agents as the cause of diseases classified elsewhere: Secondary | ICD-10-CM

## 2023-05-06 DIAGNOSIS — J019 Acute sinusitis, unspecified: Secondary | ICD-10-CM | POA: Diagnosis not present

## 2023-05-06 MED ORDER — AMOXICILLIN-POT CLAVULANATE 875-125 MG PO TABS: 1.0000 | ORAL_TABLET | Freq: Two times a day (BID) | ORAL | 0 refills | Status: AC

## 2023-05-06 NOTE — Progress Notes (Signed)
Virtual Visit Consent   Jeff Mccarthy, you are scheduled for a virtual visit with a [facility] provider today. Just as with appointments in the office, your consent must be obtained to participate. Your consent will be active for this visit and any virtual visit you may have with one of our providers in the next 365 days. If you have a MyChart account, a copy of this consent can be sent to you electronically.  As this is a virtual visit, video technology does not allow for your provider to perform a traditional examination. This may limit your provider's ability to fully assess your condition. If your provider identifies any concerns that need to be evaluated in person or the need to arrange testing (such as labs, EKG, etc.), we will make arrangements to do so. Although advances in technology are sophisticated, we cannot ensure that it will always work on either your end or our end. If the connection with a video visit is poor, the visit may have to be switched to a telephone visit. With either a video or telephone visit, we are not always able to ensure that we have a secure connection.  By engaging in this virtual visit, you consent to the provision of healthcare and authorize for your insurance to be billed (if applicable) for the services provided during this visit. Depending on your insurance coverage, you may receive a charge related to this service.  I need to obtain your verbal consent now. Are you willing to proceed with your visit today? Jeff Mccarthy has provided verbal consent on 05/06/2023 for a virtual visit (video or telephone). Freddy Finner, NP  Date: 05/06/2023 9:30 AM  Virtual Visit via Video Note   I, Freddy Finner, connected with  Jeff Mccarthy  ([id number], Feb 08, 1966) on 05/06/23 at  9:30 AM EDT by a video-enabled telemedicine application and verified that I am speaking with the correct person using two identifiers.  Location: Patient: Virtual Visit Location Patient:  Home Provider: Virtual Visit Location Provider: Home Office   I discussed the limitations of evaluation and management by telemedicine and the availability of in person appointments. The patient expressed understanding and agreed to proceed.    History of Present Illness: Jeff Mccarthy is a 57 y.o. who identifies as a male who was assigned male at birth, and is being seen today for sinus infection  Onset was 7 days ago Associated symptoms are cough, congestion, sinus pressure, hoarseness  Modifying factors are OTC day and nyquil Denies chest pain, shortness of breath, fevers, chills  Exposure to sick contacts- unknown Reports getting sick like this a few times a year     Problems:  Patient Active Problem List   Diagnosis Date Noted   Coronary artery disease involving native coronary artery of native heart without angina pectoris 02/13/2023   Chronic combined systolic and diastolic heart failure (HCC) -> essentially resolved 01/14/2022   Hyperlipidemia associated with type 2 diabetes mellitus (HCC) 08/09/2021   CAD S/P DES PCI mid to distal LAD 08/09/2021   Type 2 diabetes mellitus with complication, without long-term current use of insulin (HCC) 04/20/2015    Allergies:  Allergies  Allergen Reactions   Biaxin [Clarithromycin] Other (See Comments)    GI upset   Medications:  Current Outpatient Medications:    atorvastatin (LIPITOR) 40 MG tablet, Take 1 tablet (40 mg total) by mouth daily., Disp: 90 tablet, Rfl: 3   carvedilol (COREG) 3.125 MG tablet, Take 1 tablet (3.125  mg total) by mouth 2 (two) times daily with a meal., Disp: 180 tablet, Rfl: 3   dapagliflozin propanediol (FARXIGA) 10 MG TABS tablet, Take 1 tablet (10 mg total) by mouth daily., Disp: 90 tablet, Rfl: 3   metFORMIN (GLUCOPHAGE) 500 MG tablet, TAKE (1) TABLET BY MOUTH TWICE A DAY WITH MEALS., Disp: 180 tablet, Rfl: 1   sacubitril-valsartan (ENTRESTO) 24-26 MG, Take 1 tablet by mouth 2 (two) times daily., Disp: ,  Rfl:    ticagrelor (BRILINTA) 60 MG TABS tablet, Take 1 tablet (60 mg total) by mouth 2 (two) times daily., Disp: 180 tablet, Rfl: 3  Observations/Objective: Patient is well-developed, well-nourished in no acute distress.  Resting comfortably  at home.  Head is normocephalic, atraumatic.  No labored breathing.  Speech is clear and coherent with logical content.  Patient is alert and oriented at baseline.    Assessment and Plan:  1. Acute bacterial sinusitis  - amoxicillin-clavulanate (AUGMENTIN) 875-125 MG tablet; Take 1 tablet by mouth 2 (two) times daily for 7 days.  Dispense: 14 tablet; Refill: 0  -Take meds as prescribed -Rest -Use a cool mist humidifier especially during the winter months when heat dries out the air.  -stay hydrated by drinking plenty of fluids   Reviewed side effects, risks and benefits of medication.    Patient acknowledged agreement and understanding of the plan.   Past Medical, Surgical, Social History, Allergies, and Medications have been Reviewed.   Follow Up Instructions: I discussed the assessment and treatment plan with the patient. The patient was provided an opportunity to ask questions and all were answered. The patient agreed with the plan and demonstrated an understanding of the instructions.  A copy of instructions were sent to the patient via MyChart unless otherwise noted below.    The patient was advised to call back or seek an in-person evaluation if the symptoms worsen or if the condition fails to improve as anticipated.  Time:  I spent 10 minutes with the patient via telehealth technology discussing the above problems/concerns.    Freddy Finner, NP

## 2023-05-06 NOTE — Patient Instructions (Signed)
Jeff Mccarthy, thank you for joining Freddy Finner, NP for today's virtual visit.  While this provider is not your primary care provider (PCP), if your PCP is located in our provider database this encounter information will be shared with them immediately following your visit.   A [facility] MyChart account gives you access to today's visit and all your visits, tests, and labs performed at [facility] " click here if you don't have a [facility] MyChart account or go to [email or web address]  Consent: (Patient) Jeff Mccarthy provided verbal consent for this virtual visit at the beginning of the encounter.  Current Medications:  Current Outpatient Medications:    amoxicillin-clavulanate (AUGMENTIN) 875-125 MG tablet, Take 1 tablet by mouth 2 (two) times daily for 7 days., Disp: 14 tablet, Rfl: 0   atorvastatin (LIPITOR) 40 MG tablet, Take 1 tablet (40 mg total) by mouth daily., Disp: 90 tablet, Rfl: 3   carvedilol (COREG) 3.125 MG tablet, Take 1 tablet (3.125 mg total) by mouth 2 (two) times daily with a meal., Disp: 180 tablet, Rfl: 3   dapagliflozin propanediol (FARXIGA) 10 MG TABS tablet, Take 1 tablet (10 mg total) by mouth daily., Disp: 90 tablet, Rfl: 3   metFORMIN (GLUCOPHAGE) 500 MG tablet, TAKE (1) TABLET BY MOUTH TWICE A DAY WITH MEALS., Disp: 180 tablet, Rfl: 1   sacubitril-valsartan (ENTRESTO) 24-26 MG, Take 1 tablet by mouth 2 (two) times daily., Disp: , Rfl:    ticagrelor (BRILINTA) 60 MG TABS tablet, Take 1 tablet (60 mg total) by mouth 2 (two) times daily., Disp: 180 tablet, Rfl: 3   Medications ordered in this encounter:  Meds ordered this encounter  Medications   amoxicillin-clavulanate (AUGMENTIN) 875-125 MG tablet    Sig: Take 1 tablet by mouth 2 (two) times daily for 7 days.    Dispense:  14 tablet    Refill:  0    Order Specific Question:   Supervising Provider    Answer:   Merrilee Jansky [id number]     *If you need refills on other medications  prior to your next appointment, please contact your pharmacy*  Follow-Up: Call back or seek an in-person evaluation if the symptoms worsen or if the condition fails to improve as anticipated.  [facility] [phone]  Other Instructions Sinus Infection, Adult A sinus infection is soreness and swelling (inflammation) of your sinuses. Sinuses are hollow spaces in the bones around your face. They are located: Around your eyes. In the middle of your forehead. Behind your nose. In your cheekbones. Your sinuses and nasal passages are lined with a fluid called mucus. Mucus drains out of your sinuses. Swelling can trap mucus in your sinuses. This lets germs (bacteria, virus, or fungus) grow, which leads to infection. Most of the time, this condition is caused by a virus. What are the causes? Allergies. Asthma. Germs. Things that block your nose or sinuses. Growths in the nose (nasal polyps). Chemicals or irritants in the air. A fungus. This is rare. What increases the risk? Having a weak body defense system (immune system). Doing a lot of swimming or diving. Using nasal sprays too much. Smoking. What are the signs or symptoms? The main symptoms of this condition are pain and a feeling of pressure around the sinuses. Other symptoms include: Stuffy nose (congestion). This may make it hard to breathe through your nose. Runny nose (drainage). Soreness, swelling, and warmth in the sinuses. A cough that may get worse at night.  Being unable to smell and taste. Mucus that collects in the throat or the back of the nose (postnasal drip). This may cause a sore throat or bad breath. Being very tired (fatigued). A fever. How is this diagnosed? Your symptoms. Your medical history. A physical exam. Tests to find out if your condition is short-term (acute) or long-term (chronic). Your doctor may: Check your nose for growths (polyps). Check your sinuses using a tool that has a  light on one end (endoscope). Check for allergies or germs. Do imaging tests, such as an MRI or CT scan. How is this treated? Treatment for this condition depends on the cause and whether it is short-term or long-term. If caused by a virus, your symptoms should go away on their own within 10 days. You may be given medicines to relieve symptoms. They include: Medicines that shrink swollen tissue in the nose. A spray that treats swelling of the nostrils. Rinses that help get rid of thick mucus in your nose (nasal saline washes). Medicines that treat allergies (antihistamines). Over-the-counter pain relievers. If caused by bacteria, your doctor may wait to see if you will get better without treatment. You may be given antibiotic medicine if you have: A very bad infection. A weak body defense system. If caused by growths in the nose, surgery may be needed. Follow these instructions at home: Medicines Take, use, or apply over-the-counter and prescription medicines only as told by your doctor. These may include nasal sprays. If you were prescribed an antibiotic medicine, take it as told by your doctor. Do not stop taking it even if you start to feel better. Hydrate and humidify  Drink enough water to keep your pee (urine) pale yellow. Use a cool mist humidifier to keep the humidity level in your home above 50%. Breathe in steam for 10-15 minutes, 3-4 times a day, or as told by your doctor. You can do this in the bathroom while a hot shower is running. Try not to spend time in cool or dry air. Rest Rest as much as you can. Sleep with your head raised (elevated). Make sure you get enough sleep each night. General instructions  Put a warm, moist washcloth on your face 3-4 times a day, or as often as told by your doctor. Use nasal saline washes as often as told by your doctor. Wash your hands often with soap and water. If you cannot use soap and water, use hand sanitizer. Do not smoke. Avoid  being around people who are smoking (secondhand smoke). Keep all follow-up visits. Contact a doctor if: You have a fever. Your symptoms get worse. Your symptoms do not get better within 10 days. Get help right away if: You have a very bad headache. You cannot stop vomiting. You have very bad pain or swelling around your face or eyes. You have trouble seeing. You feel confused. Your neck is stiff. You have trouble breathing. These symptoms may be an emergency. Get help right away. Call 911. Do not wait to see if the symptoms will go away. Do not drive yourself to the hospital. Summary A sinus infection is swelling of your sinuses. Sinuses are hollow spaces in the bones around your face. This condition is caused by tissues in your nose that become inflamed or swollen. This traps germs. These can lead to infection. If you were prescribed an antibiotic medicine, take it as told by your doctor. Do not stop taking it even if you start to feel better. Keep all follow-up  visits. This information is not intended to replace advice given to you by your health care provider. Make sure you discuss any questions you have with your health care provider. Document Revised: 10/14/2021 Document Reviewed: 10/14/2021 Elsevier Patient Education  2024 Elsevier Inc.    If you have been instructed to have an in-person evaluation today at a local Urgent Care facility, please use the link below. It will take you to a list of all of our available [facility], including address, phone number and hours of operation. Please do not delay care.  [facility]  If you or a family member do not have a primary care provider, use the link below to schedule a visit and establish care. When you choose a [facility] primary care physician or advanced practice provider, you gain a long-term partner in health. Find a Primary Care Provider  Learn more about [facility]'s in-office and virtual care  options: [facility] - Get Care Now

## 2023-06-07 ENCOUNTER — Encounter: Payer: Self-pay | Admitting: *Deleted

## 2023-07-13 ENCOUNTER — Ambulatory Visit: Payer: PRIVATE HEALTH INSURANCE | Admitting: Family Medicine

## 2023-07-13 DIAGNOSIS — I5042 Chronic combined systolic (congestive) and diastolic (congestive) heart failure: Secondary | ICD-10-CM | POA: Diagnosis not present

## 2023-07-13 DIAGNOSIS — G8929 Other chronic pain: Secondary | ICD-10-CM

## 2023-07-13 DIAGNOSIS — E785 Hyperlipidemia, unspecified: Secondary | ICD-10-CM

## 2023-07-13 DIAGNOSIS — M25512 Pain in left shoulder: Secondary | ICD-10-CM

## 2023-07-13 DIAGNOSIS — E1169 Type 2 diabetes mellitus with other specified complication: Secondary | ICD-10-CM | POA: Diagnosis not present

## 2023-07-13 DIAGNOSIS — I2510 Atherosclerotic heart disease of native coronary artery without angina pectoris: Secondary | ICD-10-CM

## 2023-07-13 DIAGNOSIS — M25511 Pain in right shoulder: Secondary | ICD-10-CM

## 2023-07-13 DIAGNOSIS — E118 Type 2 diabetes mellitus with unspecified complications: Secondary | ICD-10-CM

## 2023-07-13 DIAGNOSIS — Z9861 Coronary angioplasty status: Secondary | ICD-10-CM

## 2023-07-13 DIAGNOSIS — Z7984 Long term (current) use of oral hypoglycemic drugs: Secondary | ICD-10-CM

## 2023-07-13 DIAGNOSIS — M25519 Pain in unspecified shoulder: Secondary | ICD-10-CM | POA: Insufficient documentation

## 2023-07-13 MED ORDER — ATORVASTATIN CALCIUM 40 MG PO TABS: 40.0000 mg | ORAL_TABLET | Freq: Every day | ORAL | 3 refills | Status: DC

## 2023-07-13 MED ORDER — DAPAGLIFLOZIN PROPANEDIOL 10 MG PO TABS: 10.0000 mg | ORAL_TABLET | Freq: Every day | ORAL | 3 refills | Status: DC

## 2023-07-13 MED ORDER — ENTRESTO 24-26 MG PO TABS: 1.0000 | ORAL_TABLET | Freq: Two times a day (BID) | ORAL | 3 refills | Status: DC

## 2023-07-13 MED ORDER — CARVEDILOL 3.125 MG PO TABS: 3.1250 mg | ORAL_TABLET | Freq: Two times a day (BID) | ORAL | 3 refills | Status: DC

## 2023-07-13 MED ORDER — METFORMIN HCL 500 MG PO TABS: ORAL_TABLET | ORAL | 1 refills | Status: DC

## 2023-07-13 NOTE — Assessment & Plan Note (Signed)
Discussed referral to orthopedics.  Patient wants to wait at this time.  Advised against use of NSAIDs given cardiac history.

## 2023-07-13 NOTE — Assessment & Plan Note (Signed)
Lipids at goal on Lipitor.  Continue.

## 2023-07-13 NOTE — Patient Instructions (Signed)
Continue your medications.  Let me know if you want to see Ortho.  Follow up in 6 months.  Labs today.

## 2023-07-13 NOTE — Assessment & Plan Note (Signed)
Needs updated echo.  Continue current medications.  Euvolemic today.

## 2023-07-13 NOTE — Progress Notes (Signed)
Subjective:  Patient ID: Jeff Mccarthy, male    DOB: 1966/08/25  Age: 57 y.o. MRN: [id number]  CC: Follow up   HPI:  57 year old male with coronary artery disease, combined systolic and diastolic heart failure, DM-2, HLD presents for follow-up.  Lipids have been at goal on atorvastatin.  He is tolerating.  A1c has been at goal on metformin and [place].  Needs repeat A1c today.  No proteinuria.  Blood pressure well-controlled at this time.  Patient denies chest pain or shortness of breath.  He is overall feeling well.  However, he is having issues with bilateral shoulder pain.  Left greater than right.  Has been worsening over the past 6 months.  Pain with overhead motion/activity.   Patient Active Problem List   Diagnosis Date Noted   Shoulder pain 07/13/2023   Chronic combined systolic and diastolic heart failure (HCC) -> essentially resolved 01/14/2022   Hyperlipidemia associated with type 2 diabetes mellitus (HCC) 08/09/2021   CAD S/P DES PCI mid to distal LAD 08/09/2021   Type 2 diabetes mellitus with complication, without long-term current use of insulin (HCC) 04/20/2015    Social Hx   Social History   Socioeconomic History   Marital status: Married    Spouse name: Not on file   Number of children: Not on file   Years of education: Not on file   Highest education level: Not on file  Occupational History   Not on file  Tobacco Use   Smoking status: Former    Current packs/day: 0.00    Types: Cigarettes    Quit date: 11/22/2014    Years since quitting: 8.6   Smokeless tobacco: Never  Vaping Use   Vaping status: Never Used  Substance and Sexual Activity   Alcohol use: Yes   Drug use: Never   Sexual activity: Not on file  Other Topics Concern   Not on file  Social History Narrative   Not on file   Social Determinants of Health   Financial Resource Strain: Not on file  Food Insecurity: Not on file  Transportation Needs: Not on file  Physical Activity:  Not on file  Stress: Not on file  Social Connections: Not on file    Review of Systems Per HPI  Objective:  BP 100/70   Pulse 76   Wt 210 lb 6.4 oz (95.4 kg)   SpO2 98%   BMI 30.19 kg/m      07/13/2023    9:51 AM 02/12/2023    9:57 AM 01/12/2023    9:41 AM  BP/Weight  Systolic BP 100 118 111  Diastolic BP 70 82 74  Wt. (Lbs) 210.4 210 217  BMI 30.19 kg/m2 30.13 kg/m2 31.14 kg/m2    Physical Exam Constitutional:      General: He is not in acute distress.    Appearance: Normal appearance.  HENT:     Head: Normocephalic and atraumatic.  Eyes:     General:        Right eye: No discharge.        Left eye: No discharge.     Conjunctiva/sclera: Conjunctivae normal.  Cardiovascular:     Rate and Rhythm: Normal rate and regular rhythm.  Pulmonary:     Effort: Pulmonary effort is normal.     Breath sounds: Normal breath sounds. No wheezing, rhonchi or rales.  Musculoskeletal:     Comments: Shoulders: Painful arc.  Neurological:     Mental Status: He is alert.  Psychiatric:  Mood and Affect: Mood normal.        Behavior: Behavior normal.     Lab Results  Component Value Date   WBC 6.7 01/12/2023   HGB 16.5 01/12/2023   HCT 46.4 01/12/2023   PLT 173 01/12/2023   GLUCOSE 127 (H) 01/12/2023   CHOL 112 01/12/2023   TRIG 83 01/12/2023   HDL 47 01/12/2023   LDLCALC 49 01/12/2023   ALT 22 01/12/2023   AST 16 01/12/2023   NA 141 01/12/2023   K 4.4 01/12/2023   CL 101 01/12/2023   CREATININE 0.89 01/12/2023   BUN 12 01/12/2023   CO2 24 01/12/2023   HGBA1C 6.2 (H) 01/12/2023     Assessment & Plan:   Problem List Items Addressed This Visit       Cardiovascular and Mediastinum   Chronic combined systolic and diastolic heart failure (HCC) -> essentially resolved (Chronic)    Needs updated echo.  Continue current medications.  Euvolemic today.      Relevant Medications   atorvastatin (LIPITOR) 40 MG tablet   carvedilol (COREG) 3.125 MG tablet    sacubitril-valsartan (ENTRESTO) 24-26 MG   CAD S/P DES PCI mid to distal LAD (Chronic)    Stable.  Asymptomatic.  Continue current medications.      Relevant Medications   atorvastatin (LIPITOR) 40 MG tablet   carvedilol (COREG) 3.125 MG tablet   sacubitril-valsartan (ENTRESTO) 24-26 MG     Endocrine   Type 2 diabetes mellitus with complication, without long-term current use of insulin (HCC) (Chronic)    A1c today.  Has been well-controlled on Farxiga and metformin.  Continue.      Relevant Medications   atorvastatin (LIPITOR) 40 MG tablet   dapagliflozin propanediol (FARXIGA) 10 MG TABS tablet   metFORMIN (GLUCOPHAGE) 500 MG tablet   Other Relevant Orders   CMP14+EGFR   Hemoglobin A1c   Hyperlipidemia associated with type 2 diabetes mellitus (HCC) (Chronic)    Lipids at goal on Lipitor.  Continue.      Relevant Medications   atorvastatin (LIPITOR) 40 MG tablet   carvedilol (COREG) 3.125 MG tablet   dapagliflozin propanediol (FARXIGA) 10 MG TABS tablet   metFORMIN (GLUCOPHAGE) 500 MG tablet   sacubitril-valsartan (ENTRESTO) 24-26 MG   Other Relevant Orders   Lipid panel     Other   Shoulder pain    Discussed referral to orthopedics.  Patient wants to wait at this time.  Advised against use of NSAIDs given cardiac history.       Meds ordered this encounter  Medications   atorvastatin (LIPITOR) 40 MG tablet    Sig: Take 1 tablet (40 mg total) by mouth daily.    Dispense:  90 tablet    Refill:  3    Discontinue  80 mg dose prescription   carvedilol (COREG) 3.125 MG tablet    Sig: Take 1 tablet (3.125 mg total) by mouth 2 (two) times daily with a meal.    Dispense:  180 tablet    Refill:  3   dapagliflozin propanediol (FARXIGA) 10 MG TABS tablet    Sig: Take 1 tablet (10 mg total) by mouth daily.    Dispense:  90 tablet    Refill:  3   metFORMIN (GLUCOPHAGE) 500 MG tablet    Sig: TAKE (1) TABLET BY MOUTH TWICE A DAY WITH MEALS.    Dispense:  180 tablet     Refill:  1   sacubitril-valsartan (ENTRESTO) 24-26 MG  Sig: Take 1 tablet by mouth 2 (two) times daily.    Dispense:  180 tablet    Refill:  3    Follow-up:  6 months  Antwoine Zorn Adriana Simas DO [facility]

## 2023-07-13 NOTE — Assessment & Plan Note (Signed)
A1c today.  Has been well-controlled on Farxiga and metformin.  Continue.

## 2023-07-13 NOTE — Assessment & Plan Note (Signed)
Stable.  Asymptomatic.  Continue current medications.

## 2023-07-14 LAB — CMP14+EGFR
ALT: 23 IU/L (ref 0–44)
AST: 15 IU/L (ref 0–40)
Albumin: 5.2 g/dL — ABNORMAL HIGH (ref 3.8–4.9)
Alkaline Phosphatase: 54 IU/L (ref 44–121)
BUN/Creatinine Ratio: 19 (ref 9–20)
BUN: 18 mg/dL (ref 6–24)
Bilirubin Total: 1.1 mg/dL (ref 0.0–1.2)
CO2: 24 mmol/L (ref 20–29)
Calcium: 9.9 mg/dL (ref 8.7–10.2)
Chloride: 100 mmol/L (ref 96–106)
Creatinine, Ser: 0.94 mg/dL (ref 0.76–1.27)
Globulin, Total: 2.3 g/dL (ref 1.5–4.5)
Glucose: 121 mg/dL — ABNORMAL HIGH (ref 70–99)
Potassium: 4.4 mmol/L (ref 3.5–5.2)
Sodium: 139 mmol/L (ref 134–144)
Total Protein: 7.5 g/dL (ref 6.0–8.5)
eGFR: 96 mL/min/{1.73_m2} (ref >59)

## 2023-07-14 LAB — LIPID PANEL
Chol/HDL Ratio: 2.6 ratio (ref 0.0–5.0)
Cholesterol, Total: 134 mg/dL (ref 100–199)
HDL: 52 mg/dL (ref >39)
LDL Chol Calc (NIH): 62 mg/dL (ref 0–99)
Triglycerides: 112 mg/dL (ref 0–149)
VLDL Cholesterol Cal: 20 mg/dL (ref 5–40)

## 2023-07-14 LAB — HEMOGLOBIN A1C
Est. average glucose Bld gHb Est-mCnc: 126 mg/dL
Hgb A1c MFr Bld: 6 % — ABNORMAL HIGH (ref 4.8–5.6)

## 2023-08-02 ENCOUNTER — Other Ambulatory Visit: Payer: Self-pay | Admitting: Cardiology

## 2023-08-12 ENCOUNTER — Encounter: Payer: Self-pay | Admitting: Cardiology

## 2023-08-12 ENCOUNTER — Ambulatory Visit: Payer: 59 | Attending: Cardiology | Admitting: Cardiology

## 2023-08-12 VITALS — BP 102/72 | HR 75 | Ht 70.0 in | Wt 212.2 lb

## 2023-08-12 DIAGNOSIS — M25512 Pain in left shoulder: Secondary | ICD-10-CM

## 2023-08-12 DIAGNOSIS — E1169 Type 2 diabetes mellitus with other specified complication: Secondary | ICD-10-CM

## 2023-08-12 DIAGNOSIS — I2510 Atherosclerotic heart disease of native coronary artery without angina pectoris: Secondary | ICD-10-CM

## 2023-08-12 DIAGNOSIS — I252 Old myocardial infarction: Secondary | ICD-10-CM

## 2023-08-12 DIAGNOSIS — I5042 Chronic combined systolic (congestive) and diastolic (congestive) heart failure: Secondary | ICD-10-CM

## 2023-08-12 DIAGNOSIS — G8929 Other chronic pain: Secondary | ICD-10-CM

## 2023-08-12 DIAGNOSIS — M25511 Pain in right shoulder: Secondary | ICD-10-CM

## 2023-08-12 DIAGNOSIS — Z9861 Coronary angioplasty status: Secondary | ICD-10-CM

## 2023-08-12 DIAGNOSIS — E785 Hyperlipidemia, unspecified: Secondary | ICD-10-CM

## 2023-08-12 NOTE — Assessment & Plan Note (Signed)
EF improved fully back to 55 to 60%.  There is some regional wall motion abnormality related to his MI but no longer having reduced EF.  NYHA class I if not 0 symptoms.   With recovery of function from repeat fusion and initiation of GDMT will continue Entresto and Coreg at low doses along with [place].

## 2023-08-12 NOTE — Assessment & Plan Note (Signed)
Being followed by orthopedic surgery, potential concern for need to have shoulder surgery.  If this would be the case, with him having absolutely no cardiac symptoms and well over 8 METS, he would be fine to proceed with surgery without any further evaluation.  Would be fine to hold Brilinta 5 to 7 days preop for surgeries or procedures.

## 2023-08-12 NOTE — Progress Notes (Signed)
Cardiology Office Note:  .   Date:  08/12/2023  ID:  Jeff Mccarthy, DOB [birth year], MRN [id number] PCP: Jeff Sams DO  [facility] Providers Cardiologist:  Jeff Lemma, MD     Chief Complaint  Patient presents with   Follow-up    6 month follow up. Patient feels well. Medications reviewed verbally.    Coronary Artery Disease    Doing remarkably well with no further anginal symptoms.    History of Present Illness: .      Jeff Mccarthy is a  57 y.o. male  with a PMH noted below who presents here for  6 month (2 yr from MI)at the request of Jeff Sams, DO.  08/09/2021: 1 V CAD; -> Anterior STEMI:  p LAD 30=> p-mLAD 60%=> mLAD (after D2) 100%-70% => 2 overlapping DES PCI Onyx Frontier prox-mid LAD (3.0 x 18 and 3.0 x 26-tapered postdilation 4.1-3.3 mm. Ischemic CM - resolved: => Post MI EF 35 to 40% with distal inferior, mid to distal inferoseptal and apical wall hypokinesis to akinesis. 10/2021: EF 55 to 60%.  Mid to apical septal and apical wall hypokinetic.  Septal hypertrophy.  GR 1 DD.  Jeff Mccarthy was last seen on 02/12/23 as a a 6 month - doing well. + wgt loss.  Doing better on low dose Brlinta & off of ASA. --> decreased atorvastatin to 40mg . Continued other meds.      Subjective  INTERVAL HISTORY Jeff Mccarthy returns today doing well.  Remains very active, but has settled down some of his exercises because of some shoulder pains and he is thinking he may need to have shoulder surgery done.  He is heading off to [place] later on this afternoon with his wife and some friends who are looking up with their.  This is to celebrate his wife's birthday.  This is his first trip to [place] and is very excited.  We spent some time talking about it.  He tells me that he is feeling great he has not had any issues over the last 6 months with any chest pain pressure or dyspnea.  No melena or hematochezia or hematuria.  He is now 2 years out from his MI and very grateful  for how well he feels.  Tolerating Entresto no heart failure symptoms of PND, orthopnea, or edema.  No angina with rest or exertion.    Cardiovascular ROS: no chest pain or dyspnea on exertion negative for - edema, irregular heartbeat, orthopnea, palpitations, paroxysmal nocturnal dyspnea, rapid heart rate, shortness of breath, or lightheadedness, dizziness or wooziness, syncope or near skin, TIA or emesis fugax medication  ROS:  Review of Systems - Negative except right shoulder discomfort which is probably potentially the need to have surgical repair.     Objective   Studies Reviewed: .       Diagnostic                                          Intervention: Overlapping Onyx Frontier DES 3 x 18 & 3 x 26 (tapered 4.1-3.74mm)    Follow-up Echo 10/2021: Improved LVEF up to 55 to 60%.  Hypokinesis of the mid to apical anterior wall.  Septal hypertrophy.  GR 1 DD.  Notable improvement in wall motion and EF.  Normal valves.  Normal RAP, PAP.  Component Ref Range & Units  1 mo ago (07/13/23) 7 mo ago (01/12/23) 1 yr ago (06/30/22)  Cholesterol, Total 100 - 199 mg/dL 761 607 371  Triglycerides 0 - 149 mg/dL 062 83 93  HDL >69 mg/dL 52 47 42  VLDL Cholesterol Cal 5 - 40 mg/dL 20 16 18   LDL Chol Calc (NIH) 0 - 99 mg/dL 62 49 42   Hgb S8N MFr Bld 4.8 - 5.6 % 6.0 High  6.2 High  CM    Risk Assessment/Calculations:               Physical Exam:   VS:  BP 102/72 (BP Location: Left Arm, Patient Position: Sitting, Cuff Size: Normal)   Pulse 75   Ht 5\' 10"  (1.778 m)   Wt 212 lb 3.2 oz (96.3 kg)   SpO2 96%   BMI 30.45 kg/m    Wt Readings from Last 3 Encounters:  08/12/23 212 lb 3.2 oz (96.3 kg)  07/13/23 210 lb 6.4 oz (95.4 kg)  02/12/23 210 lb (95.3 kg)    GEN: Well nourished, well developed in no acute distress; mildly obese. Well groomed.  NECK: No JVD; No carotid bruits CARDIAC: Normal S1, S2; RRR, no murmurs, rubs, gallops RESPIRATORY:  Clear to auscultation without rales,  wheezing or rhonchi ; nonlabored, good air movement. ABDOMEN: Soft, non-tender, non-distended EXTREMITIES:  No edema; No deformity      ASSESSMENT AND PLAN: .    Problem List Items Addressed This Visit       Cardiology Problems   CAD S/P DES PCI mid to distal LAD - Primary (Chronic)    Extensive LAD PCI crossing a major diagonal branch.  Overlapping stents placed.  Restored flow down the LAD and recovered majority the pump function of the anterior wall.  Some mild hypokinesis but no further CHF or anginal symptoms.  EF back to 55 to 60%.  Plan: Is on low-dose Entresto and low-dose carvedilol with well-controlled blood pressure, no hypotension issues although pressures 102/72 with no symptoms.  This was for the reduced EF initially and stabilized his function. Remains on 40 mg Lipitor and 500 mg metformin plus Farxiga with well-controlled lipids and A1c. Remains on maintenance dose Brilinta 60 g twice daily but the extent of stents in the LAD I think it is reasonable to maintain maintenance treatment.  If this becomes a financial burden, could switch to Plavix 75 mg daily. Okay to hold Brilinta at this point 5 to 7 days preop for surgeries or procedures.       Chronic combined systolic and diastolic heart failure (HCC) -> essentially resolved (Chronic)    EF improved fully back to 55 to 60%.  There is some regional wall motion abnormality related to his MI but no longer having reduced EF.  NYHA class I if not 0 symptoms.   With recovery of function from repeat fusion and initiation of GDMT will continue Entresto and Coreg at low doses along with [place].      Hyperlipidemia associated with type 2 diabetes mellitus (HCC) (Chronic)    Most recent lipid panel was a little bit less impressive than the labs from 1 year and  and 7 months ago since we reduced his dose of atorvastatin from 80-40, but he still has an LDL of 62.  Would monitor this to see if he continues to go up if so, would  probably convert to rosuvastatin 40 mg to get more potency.  I do not want to keep him on 80 mg Lipitor  for fear of worsening myalgias.  A1c is 6.0 and doing well.  He has lost weight and is also on Farxiga and metformin with well-controlled diet.        Other   History of anterior ST elevation myocardial infarction (STEMI) (Chronic)    2 years out from anterior STEMI with extensive LAD disease treated with 2 overlapping stents.  Has had essentially resolution of ischemic cardiomyopathy reduced EF now most recently EF 55 to 60%.  Does have regional wall motion abnormality in the anterior wall but doing remarkably well.  No further angina or heart failure symptoms.      Relevant Orders   EKG 12-Lead   Shoulder pain-potential preoperative evaluation    Being followed by orthopedic surgery, potential concern for need to have shoulder surgery.  If this would be the case, with him having absolutely no cardiac symptoms and well over 8 METS, he would be fine to proceed with surgery without any further evaluation.  Would be fine to hold Brilinta 5 to 7 days preop for surgeries or procedures.               Dispo: Return in about 1 year (around 08/11/2024).  Total time spent: 19 min spent with patient + 17 min spent charting = 36 min     Signed, Marykay Lex, MD, MS Jeff Mccarthy, M.D., M.S. Interventional Cardiologist  [facility]

## 2023-08-12 NOTE — Assessment & Plan Note (Signed)
Most recent lipid panel was a little bit less impressive than the labs from 1 year and  and 7 months ago since we reduced his dose of atorvastatin from 80-40, but he still has an LDL of 62.  Would monitor this to see if he continues to go up if so, would probably convert to rosuvastatin 40 mg to get more potency.  I do not want to keep him on 80 mg Lipitor for fear of worsening myalgias.  A1c is 6.0 and doing well.  He has lost weight and is also on Farxiga and metformin with well-controlled diet.

## 2023-08-12 NOTE — Patient Instructions (Addendum)
Medication Instructions:  - Your physician recommends that you continue on your current medications as directed. Please refer to the Current Medication list given to you today.  *If you need a refill on your cardiac medications before your next appointment, please call your pharmacy*   Lab Work: - none ordered  If you have labs (blood work) drawn today and your tests are completely normal, you will receive your results only by: MyChart Message (if you have MyChart) OR A paper copy in the mail If you have any lab test that is abnormal or we need to change your treatment, we will call you to review the results.   Testing/Procedures: - none ordered   Follow-Up: At [facility], you and your health needs are our priority.  As part of our continuing mission to provide you with exceptional heart care, we have created designated Provider Care Teams.  These Care Teams include your primary Cardiologist (physician) and Advanced Practice Providers (APPs -  Physician Assistants and Nurse Practitioners) who all work together to provide you with the care you need, when you need it.  We recommend signing up for the patient portal called "MyChart".  Sign up information is provided on this After Visit Summary.  MyChart is used to connect with patients for Virtual Visits (Telemedicine).  Patients are able to view lab/test results, encounter notes, upcoming appointments, etc.  Non-urgent messages can be sent to your provider as well.   To learn more about what you can do with MyChart, go to [email or web address].    Your next appointment:   1 year(s)  Provider:   You may see Bryan Lemma, MD or one of the following Advanced Practice Providers on your designated Care Team:   Nicolasa Ducking, NP Eula Listen, PA-C Cadence Fransico Michael, PA-C Charlsie Quest, NP    Other Instructions N/a

## 2023-08-12 NOTE — Assessment & Plan Note (Signed)
2 years out from anterior STEMI with extensive LAD disease treated with 2 overlapping stents.  Has had essentially resolution of ischemic cardiomyopathy reduced EF now most recently EF 55 to 60%.  Does have regional wall motion abnormality in the anterior wall but doing remarkably well.  No further angina or heart failure symptoms.

## 2023-08-12 NOTE — Assessment & Plan Note (Signed)
Extensive LAD PCI crossing a major diagonal branch.  Overlapping stents placed.  Restored flow down the LAD and recovered majority the pump function of the anterior wall.  Some mild hypokinesis but no further CHF or anginal symptoms.  EF back to 55 to 60%.  Plan: Is on low-dose Entresto and low-dose carvedilol with well-controlled blood pressure, no hypotension issues although pressures 102/72 with no symptoms.  This was for the reduced EF initially and stabilized his function. Remains on 40 mg Lipitor and 500 mg metformin plus Farxiga with well-controlled lipids and A1c. Remains on maintenance dose Brilinta 60 g twice daily but the extent of stents in the LAD I think it is reasonable to maintain maintenance treatment.  If this becomes a financial burden, could switch to Plavix 75 mg daily. Okay to hold Brilinta at this point 5 to 7 days preop for surgeries or procedures.

## 2023-12-08 ENCOUNTER — Other Ambulatory Visit: Payer: Self-pay | Admitting: Family Medicine

## 2024-01-13 ENCOUNTER — Ambulatory Visit: Payer: No Typology Code available for payment source | Admitting: Family Medicine

## 2024-01-24 ENCOUNTER — Other Ambulatory Visit: Payer: Self-pay | Admitting: Family Medicine

## 2024-02-01 ENCOUNTER — Encounter: Payer: Self-pay | Admitting: Family Medicine

## 2024-02-01 ENCOUNTER — Ambulatory Visit: Payer: No Typology Code available for payment source | Admitting: Family Medicine

## 2024-02-01 VITALS — BP 122/84 | HR 85 | Temp 97.3°F | Ht 70.0 in | Wt 210.0 lb

## 2024-02-01 DIAGNOSIS — E1169 Type 2 diabetes mellitus with other specified complication: Secondary | ICD-10-CM | POA: Diagnosis not present

## 2024-02-01 DIAGNOSIS — Z125 Encounter for screening for malignant neoplasm of prostate: Secondary | ICD-10-CM | POA: Diagnosis not present

## 2024-02-01 DIAGNOSIS — Z23 Encounter for immunization: Secondary | ICD-10-CM

## 2024-02-01 DIAGNOSIS — Z130 Encounter for screening for diseases of the blood and blood-forming organs and certain disorders involving the immune mechanism: Secondary | ICD-10-CM | POA: Diagnosis not present

## 2024-02-01 DIAGNOSIS — I2510 Atherosclerotic heart disease of native coronary artery without angina pectoris: Secondary | ICD-10-CM

## 2024-02-01 DIAGNOSIS — Z9861 Coronary angioplasty status: Secondary | ICD-10-CM

## 2024-02-01 DIAGNOSIS — E118 Type 2 diabetes mellitus with unspecified complications: Secondary | ICD-10-CM

## 2024-02-01 DIAGNOSIS — I5042 Chronic combined systolic (congestive) and diastolic (congestive) heart failure: Secondary | ICD-10-CM

## 2024-02-01 DIAGNOSIS — E785 Hyperlipidemia, unspecified: Secondary | ICD-10-CM

## 2024-02-01 NOTE — Patient Instructions (Signed)
Labs at your convenience.  Follow up in 6 months.  Take care  Dr. Adriana Simas

## 2024-02-02 LAB — LIPID PANEL
Chol/HDL Ratio: 2.4 ratio (ref 0.0–5.0)
Cholesterol, Total: 141 mg/dL (ref 100–199)
HDL: 60 mg/dL (ref >39)
LDL Chol Calc (NIH): 56 mg/dL (ref 0–99)
Triglycerides: 151 mg/dL — ABNORMAL HIGH (ref 0–149)
VLDL Cholesterol Cal: 25 mg/dL (ref 5–40)

## 2024-02-02 LAB — CBC
Hematocrit: 49.1 % (ref 37.5–51.0)
Hemoglobin: 17.2 g/dL (ref 13.0–17.7)
MCH: 33.2 pg — ABNORMAL HIGH (ref 26.6–33.0)
MCHC: 35 g/dL (ref 31.5–35.7)
MCV: 95 fL (ref 79–97)
Platelets: 196 10*3/uL (ref 150–450)
RBC: 5.18 x10E6/uL (ref 4.14–5.80)
RDW: 12.3 % (ref 11.6–15.4)
WBC: 8 10*3/uL (ref 3.4–10.8)

## 2024-02-02 LAB — CMP14+EGFR
ALT: 24 IU/L (ref 0–44)
AST: 20 IU/L (ref 0–40)
Albumin: 5 g/dL — ABNORMAL HIGH (ref 3.8–4.9)
Alkaline Phosphatase: 58 IU/L (ref 44–121)
BUN/Creatinine Ratio: 21 — ABNORMAL HIGH (ref 9–20)
BUN: 18 mg/dL (ref 6–24)
Bilirubin Total: 0.7 mg/dL (ref 0.0–1.2)
CO2: 23 mmol/L (ref 20–29)
Calcium: 9.7 mg/dL (ref 8.7–10.2)
Chloride: 99 mmol/L (ref 96–106)
Creatinine, Ser: 0.84 mg/dL (ref 0.76–1.27)
Globulin, Total: 2.3 g/dL (ref 1.5–4.5)
Glucose: 107 mg/dL — ABNORMAL HIGH (ref 70–99)
Potassium: 4.2 mmol/L (ref 3.5–5.2)
Sodium: 138 mmol/L (ref 134–144)
Total Protein: 7.3 g/dL (ref 6.0–8.5)
eGFR: 102 mL/min/{1.73_m2} (ref >59)

## 2024-02-02 LAB — HEMOGLOBIN A1C
Est. average glucose Bld gHb Est-mCnc: 128 mg/dL
Hgb A1c MFr Bld: 6.1 % — ABNORMAL HIGH (ref 4.8–5.6)

## 2024-02-02 LAB — PSA: Prostate Specific Ag, Serum: 0.7 ng/mL (ref 0.0–4.0)

## 2024-02-02 LAB — MICROALBUMIN / CREATININE URINE RATIO
Creatinine, Urine: 92.6 mg/dL
Microalb/Creat Ratio: 9 mg/g{creat} (ref 0–29)
Microalbumin, Urine: 8.1 ug/mL

## 2024-02-02 MED ORDER — TICAGRELOR 60 MG PO TABS: 60.0000 mg | ORAL_TABLET | Freq: Two times a day (BID) | ORAL | 1 refills | Status: DC

## 2024-02-02 MED ORDER — ATORVASTATIN CALCIUM 40 MG PO TABS: 40.0000 mg | ORAL_TABLET | Freq: Every day | ORAL | 3 refills | Status: AC

## 2024-02-02 NOTE — Progress Notes (Signed)
 Subjective:  Patient ID: Jeff Mccarthy, male    DOB: 06-14-1966  Age: 58 y.o. MRN: [id number]  CC:   Chief Complaint  Patient presents with   Follow-up    6 month f/u     HPI:  58 year old male with coronary artery disease, combined systolic and diastolic heart failure recovered, hyperlipidemia, type 2 diabetes presents for follow-up.  Patient is doing well.  He has no complaints or concerns at this time.  Diabetes has been stable on metformin and [place].  Needs foot exam and labs today.  Coronary artery disease and heart failure stable.  He is doing well on Entresto, ticagrelor, carvedilol.  Lipids at goal on Lipitor.  Patient Active Problem List   Diagnosis Date Noted   Shoulder pain-potential preoperative evaluation 07/13/2023   History of anterior ST elevation myocardial infarction (STEMI) 08/06/2022   Chronic combined systolic and diastolic heart failure (HCC) -> essentially resolved 01/14/2022   Hyperlipidemia associated with type 2 diabetes mellitus (HCC) 08/09/2021   CAD S/P DES PCI mid to distal LAD 08/09/2021   Type 2 diabetes mellitus with complication, without long-term current use of insulin (HCC) 04/20/2015    Social Hx   Social History   Socioeconomic History   Marital status: Married    Spouse name: Not on file   Number of children: Not on file   Years of education: Not on file   Highest education level: Not on file  Occupational History   Not on file  Tobacco Use   Smoking status: Former    Current packs/day: 0.00    Types: Cigarettes    Quit date: 11/22/2014    Years since quitting: 9.2   Smokeless tobacco: Never  Vaping Use   Vaping status: Never Used  Substance and Sexual Activity   Alcohol use: Yes   Drug use: Never   Sexual activity: Not on file  Other Topics Concern   Not on file  Social History Narrative   Not on file   Social Drivers of Health   Financial Resource Strain: Not on file  Food Insecurity: Not on file   Transportation Needs: Not on file  Physical Activity: Not on file  Stress: Not on file  Social Connections: Not on file    Review of Systems Per HPI  Objective:  BP 122/84   Pulse 85   Temp (!) 97.3 F (36.3 C)   Ht 5\' 10"  (1.778 m)   Wt 210 lb (95.3 kg)   SpO2 96%   BMI 30.13 kg/m      02/01/2024    1:46 PM 08/12/2023    9:37 AM 07/13/2023    9:51 AM  BP/Weight  Systolic BP 122 102 100  Diastolic BP 84 72 70  Wt. (Lbs) 210 212.2 210.4  BMI 30.13 kg/m2 30.45 kg/m2 30.19 kg/m2    Physical Exam Vitals and nursing note reviewed.  Constitutional:      General: He is not in acute distress.    Appearance: Normal appearance.  HENT:     Head: Normocephalic and atraumatic.  Eyes:     General:        Right eye: No discharge.        Left eye: No discharge.     Conjunctiva/sclera: Conjunctivae normal.  Cardiovascular:     Rate and Rhythm: Normal rate and regular rhythm.  Pulmonary:     Effort: Pulmonary effort is normal.     Breath sounds: Normal breath sounds. No wheezing, rhonchi or  rales.  Neurological:     Mental Status: He is alert.  Psychiatric:        Mood and Affect: Mood normal.        Behavior: Behavior normal.     Lab Results  Component Value Date   WBC 8.0 02/01/2024   HGB 17.2 02/01/2024   HCT 49.1 02/01/2024   PLT 196 02/01/2024   GLUCOSE 107 (H) 02/01/2024   CHOL 141 02/01/2024   TRIG 151 (H) 02/01/2024   HDL 60 02/01/2024   LDLCALC 56 02/01/2024   ALT 24 02/01/2024   AST 20 02/01/2024   NA 138 02/01/2024   K 4.2 02/01/2024   CL 99 02/01/2024   CREATININE 0.84 02/01/2024   BUN 18 02/01/2024   CO2 23 02/01/2024   HGBA1C 6.1 (H) 02/01/2024     Assessment & Plan:  Type 2 diabetes mellitus with complication, without long-term current use of insulin (HCC) Assessment & Plan: A1c at goal.  Continue metformin and Farxiga.  Orders: -     CMP14+EGFR -     Hemoglobin A1c -     Microalbumin / creatinine urine ratio  Hyperlipidemia  associated with type 2 diabetes mellitus (HCC) Assessment & Plan: LDL at goal.  Continue Lipitor.  Orders: -     Lipid panel  Screening for deficiency anemia -     CBC  Screening PSA (prostate specific antigen) -     PSA  Immunization due -     Pneumococcal conjugate vaccine 20-valent  CAD S/P DES PCI mid to distal LAD Assessment & Plan: Stable.  Continue medications.   Chronic combined systolic and diastolic heart failure (HCC) -> essentially resolved Assessment & Plan: Euvolemic.  Stable.  Continue current medications.   Other orders -     Atorvastatin Calcium; Take 1 tablet (40 mg total) by mouth daily.  Dispense: 90 tablet; Refill: 3 -     Ticagrelor; Take 1 tablet (60 mg total) by mouth 2 (two) times daily.  Dispense: 180 tablet; Refill: 1    Follow-up: 6 months  Tiki Tucciarone Adriana Simas DO [facility]

## 2024-02-02 NOTE — Assessment & Plan Note (Signed)
Stable. Continue medications

## 2024-02-02 NOTE — Assessment & Plan Note (Signed)
 A1c at goal.  Continue metformin and [place].

## 2024-02-02 NOTE — Assessment & Plan Note (Addendum)
LDL at goal. Continue Lipitor.

## 2024-02-02 NOTE — Assessment & Plan Note (Signed)
Euvolemic. Stable. Continue current medications.

## 2024-02-03 ENCOUNTER — Encounter: Payer: Self-pay | Admitting: Family Medicine

## 2024-06-07 ENCOUNTER — Telehealth: Payer: Self-pay | Admitting: Family Medicine

## 2024-06-07 MED ORDER — FARXIGA 10 MG PO TABS: 10.0000 mg | ORAL_TABLET | Freq: Every day | ORAL | 1 refills | Status: DC

## 2024-06-07 MED ORDER — SACUBITRIL-VALSARTAN 24-26 MG PO TABS: 1.0000 | ORAL_TABLET | Freq: Two times a day (BID) | ORAL | 1 refills | Status: DC

## 2024-06-07 NOTE — Telephone Encounter (Signed)
 Refill on  Entresto  24/26 mg, farxiga  10 mh tablet  [facility]

## 2024-07-27 ENCOUNTER — Other Ambulatory Visit: Payer: Self-pay | Admitting: Family Medicine

## 2024-07-30 ENCOUNTER — Other Ambulatory Visit: Payer: Self-pay | Admitting: Family Medicine

## 2024-08-03 ENCOUNTER — Ambulatory Visit: Admitting: Family Medicine

## 2024-08-09 ENCOUNTER — Encounter: Payer: Self-pay | Admitting: Family Medicine

## 2024-08-09 ENCOUNTER — Ambulatory Visit: Admitting: Family Medicine

## 2024-08-09 VITALS — BP 120/82 | HR 87 | Temp 97.7°F | Ht 70.0 in | Wt 213.0 lb

## 2024-08-09 DIAGNOSIS — Z130 Encounter for screening for diseases of the blood and blood-forming organs and certain disorders involving the immune mechanism: Secondary | ICD-10-CM | POA: Diagnosis not present

## 2024-08-09 DIAGNOSIS — E1169 Type 2 diabetes mellitus with other specified complication: Secondary | ICD-10-CM

## 2024-08-09 DIAGNOSIS — I5042 Chronic combined systolic (congestive) and diastolic (congestive) heart failure: Secondary | ICD-10-CM | POA: Diagnosis not present

## 2024-08-09 DIAGNOSIS — Z9861 Coronary angioplasty status: Secondary | ICD-10-CM

## 2024-08-09 DIAGNOSIS — G43909 Migraine, unspecified, not intractable, without status migrainosus: Secondary | ICD-10-CM | POA: Insufficient documentation

## 2024-08-09 DIAGNOSIS — Z7984 Long term (current) use of oral hypoglycemic drugs: Secondary | ICD-10-CM

## 2024-08-09 DIAGNOSIS — I2510 Atherosclerotic heart disease of native coronary artery without angina pectoris: Secondary | ICD-10-CM

## 2024-08-09 DIAGNOSIS — E118 Type 2 diabetes mellitus with unspecified complications: Secondary | ICD-10-CM

## 2024-08-09 DIAGNOSIS — G43109 Migraine with aura, not intractable, without status migrainosus: Secondary | ICD-10-CM

## 2024-08-09 DIAGNOSIS — E785 Hyperlipidemia, unspecified: Secondary | ICD-10-CM | POA: Diagnosis not present

## 2024-08-09 MED ORDER — SUMATRIPTAN SUCCINATE 50 MG PO TABS: ORAL_TABLET | ORAL | 3 refills | Status: AC

## 2024-08-09 NOTE — Assessment & Plan Note (Signed)
 Stable and asymptomatic.  Continue current medications.

## 2024-08-09 NOTE — Assessment & Plan Note (Signed)
 At goal.  Repeating A1c.  Continue Farxiga  and metformin .

## 2024-08-09 NOTE — Assessment & Plan Note (Addendum)
 LDL  has been at goal.  Continue Lipitor .  Lipid panel today.

## 2024-08-09 NOTE — Assessment & Plan Note (Addendum)
 Imitrex  as needed.  Given his cardiovascular history, advised him to use this sparingly.  He is in agreement.

## 2024-08-09 NOTE — Progress Notes (Signed)
 Subjective:  Patient ID: Jeff Mccarthy, male    DOB: 1966-07-25  Age: 58 y.o. MRN: [id number]  CC:   Chief Complaint  Patient presents with   6 month follow up     No concerns voiced    HPI:  58 year old male with a history of coronary artery disease, chronic heart failure now essentially resolved, type 2 diabetes, hyperlipidemia presents for follow-up.  Patient states that he is doing very well.  Denies chest pain or shortness of breath.  He is able to exert himself without difficulty.  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  I see no reservations regarding this.  Blood pressure is well-controlled.  LDL has been at goal.  Needs labs today.  He is compliant with Lipitor .  A1c has been at goal as well.  He is tolerating Farxiga  and metformin .  Patient states that he has an occasional migraine which responds to Imitrex .  He would like a refill.  Patient declines vaccines today.  He is amenable to labs.  Patient will be getting his eye exam in October.  Patient Active Problem List   Diagnosis Date Noted   Migraine headache 08/09/2024   History of anterior ST elevation myocardial infarction (STEMI) 08/06/2022   Chronic combined systolic and diastolic heart failure (HCC) -> essentially resolved 01/14/2022   Hyperlipidemia associated with type 2 diabetes mellitus (HCC) 08/09/2021   CAD S/P DES PCI mid to distal LAD 08/09/2021   Type 2 diabetes mellitus with complication, without long-term current use of insulin  (HCC) 04/20/2015    Social Hx   Social History   Socioeconomic History   Marital status: Married    Spouse name: Not on file   Number of children: Not on file   Years of education: Not on file   Highest education level: Not on file  Occupational History   Not on file  Tobacco Use   Smoking status: Former    Current packs/day: 0.00    Types: Cigarettes    Quit date: 11/22/2014    Years since quitting: 9.7    Smokeless tobacco: Never  Vaping Use   Vaping status: Never Used  Substance and Sexual Activity   Alcohol use: Yes   Drug use: Never   Sexual activity: Not on file  Other Topics Concern   Not on file  Social History Narrative   Not on file   Social Drivers of Health   Financial Resource Strain: Not on file  Food Insecurity: Not on file  Transportation Needs: Not on file  Physical Activity: Not on file  Stress: Not on file  Social Connections: Not on file    Review of Systems Per HPI  Objective:  BP 120/82   Pulse 87   Temp 97.7 F (36.5 C)   Ht 5' 10 (1.778 m)   Wt 213 lb (96.6 kg)   SpO2 96%   BMI 30.56 kg/m      08/09/2024    8:27 AM 02/01/2024    1:46 PM 08/12/2023    9:37 AM  BP/Weight  Systolic BP 120 122 102  Diastolic BP 82 84 72  Wt. (Lbs) 213 210 212.2  BMI 30.56 kg/m2 30.13 kg/m2 30.45 kg/m2    Physical Exam Vitals and nursing note reviewed.  Constitutional:      General: He is not in acute distress.    Appearance: Normal appearance.  HENT:     Head: Normocephalic and atraumatic.  Eyes:     General:        Right eye: No discharge.        Left eye: No discharge.     Conjunctiva/sclera: Conjunctivae normal.  Cardiovascular:     Rate and Rhythm: Normal rate and regular rhythm.  Pulmonary:     Effort: Pulmonary effort is normal.     Breath sounds: Normal breath sounds. No wheezing, rhonchi or rales.  Neurological:     Mental Status: He is alert.  Psychiatric:        Mood and Affect: Mood normal.        Behavior: Behavior normal.     Lab Results  Component Value Date   WBC 8.0 02/01/2024   HGB 17.2 02/01/2024   HCT 49.1 02/01/2024   PLT 196 02/01/2024   GLUCOSE 107 (H) 02/01/2024   CHOL 141 02/01/2024   TRIG 151 (H) 02/01/2024   HDL 60 02/01/2024   LDLCALC 56 02/01/2024   ALT 24 02/01/2024   AST 20 02/01/2024   NA 138 02/01/2024   K 4.2 02/01/2024   CL 99 02/01/2024   CREATININE 0.84 02/01/2024   BUN 18 02/01/2024   CO2  23 02/01/2024   HGBA1C 6.1 (H) 02/01/2024     Assessment & Plan:  Type 2 diabetes mellitus with complication, without long-term current use of insulin  (HCC) Assessment & Plan: At goal.  Repeating A1c.  Continue Farxiga  and metformin .  Orders: -     CMP14+EGFR -     Hemoglobin A1c -     Microalbumin / creatinine urine ratio  Hyperlipidemia associated with type 2 diabetes mellitus (HCC) Assessment & Plan: LDL  has been at goal.  Continue Lipitor .  Lipid panel today.  Orders: -     Lipid panel  Screening for deficiency anemia -     CBC  Chronic combined systolic and diastolic heart failure (HCC) -> essentially resolved Assessment & Plan: Stable/euvolemic.  Continuing Entresto  and Farxiga .   CAD S/P DES PCI mid to distal LAD Assessment & Plan: Stable and asymptomatic.  Continue current medications.   Migraine with aura and without status migrainosus, not intractable Assessment & Plan: Imitrex  as needed.  Given his cardiovascular history, advised him to use this sparingly.  He is in agreement.  Orders: -     SUMAtriptan  Succinate; 50-100 mg as needed for migraine. May repeat in 2 hours if headache persists or recurs. Max dose 200 mg in 24 hours.  Dispense: 10 tablet; Refill: 3    Follow-up:  6 months  Daren Yeagle Bluford DO [facility]

## 2024-08-09 NOTE — Assessment & Plan Note (Signed)
 Stable/euvolemic.  Continuing Entresto  and Farxiga .

## 2024-08-09 NOTE — Patient Instructions (Signed)
 Labs today.  Medication sent in.  Follow up in 6 months   Take care  Dr. Bluford

## 2024-08-10 LAB — CMP14+EGFR
ALT: 40 IU/L (ref 0–44)
AST: 27 IU/L (ref 0–40)
Albumin: 5 g/dL — ABNORMAL HIGH (ref 3.8–4.9)
Alkaline Phosphatase: 58 IU/L (ref 47–123)
BUN/Creatinine Ratio: 19 (ref 9–20)
BUN: 20 mg/dL (ref 6–24)
Bilirubin Total: 1.4 mg/dL — ABNORMAL HIGH (ref 0.0–1.2)
CO2: 20 mmol/L (ref 20–29)
Calcium: 9.7 mg/dL (ref 8.7–10.2)
Chloride: 100 mmol/L (ref 96–106)
Creatinine, Ser: 1.04 mg/dL (ref 0.76–1.27)
Globulin, Total: 2.4 g/dL (ref 1.5–4.5)
Glucose: 128 mg/dL — ABNORMAL HIGH (ref 70–99)
Potassium: 4.5 mmol/L (ref 3.5–5.2)
Sodium: 140 mmol/L (ref 134–144)
Total Protein: 7.4 g/dL (ref 6.0–8.5)
eGFR: 84 mL/min/1.73 (ref >59)

## 2024-08-10 LAB — LIPID PANEL
Chol/HDL Ratio: 2.5 ratio (ref 0.0–5.0)
Cholesterol, Total: 122 mg/dL (ref 100–199)
HDL: 48 mg/dL (ref >39)
LDL Chol Calc (NIH): 52 mg/dL (ref 0–99)
Triglycerides: 124 mg/dL (ref 0–149)
VLDL Cholesterol Cal: 22 mg/dL (ref 5–40)

## 2024-08-10 LAB — CBC
Hematocrit: 50.3 % (ref 37.5–51.0)
Hemoglobin: 17 g/dL (ref 13.0–17.7)
MCH: 32.9 pg (ref 26.6–33.0)
MCHC: 33.8 g/dL (ref 31.5–35.7)
MCV: 97 fL (ref 79–97)
Platelets: 177 x10E3/uL (ref 150–450)
RBC: 5.17 x10E6/uL (ref 4.14–5.80)
RDW: 12.5 % (ref 11.6–15.4)
WBC: 6.2 x10E3/uL (ref 3.4–10.8)

## 2024-08-10 LAB — HEMOGLOBIN A1C
Est. average glucose Bld gHb Est-mCnc: 131 mg/dL
Hgb A1c MFr Bld: 6.2 % — ABNORMAL HIGH (ref 4.8–5.6)

## 2024-08-10 LAB — MICROALBUMIN / CREATININE URINE RATIO
Creatinine, Urine: 124.4 mg/dL
Microalb/Creat Ratio: 408 mg/g{creat} — ABNORMAL HIGH (ref 0–29)
Microalbumin, Urine: 507.8 ug/mL

## 2024-08-11 ENCOUNTER — Encounter: Payer: Self-pay | Admitting: Nurse Practitioner

## 2024-08-11 ENCOUNTER — Telehealth: Payer: Self-pay | Admitting: *Deleted

## 2024-08-11 NOTE — Telephone Encounter (Signed)
 My chart message sent to patient and Dr. Bluford about labs.

## 2024-08-11 NOTE — Telephone Encounter (Signed)
 Copied from CRM #[id number]. Topic: Clinical - Lab/Test Results >> Aug 11, 2024  3:13 PM Selinda RAMAN wrote: Reason for CRM: The patient called in extremely concerned about his lab results and would like a call back as soon as possible to discuss further. Please assist patient further as he said the Microalb/Creat ratio number states with this range he would be in kidney failure and he is concerned.

## 2024-08-13 ENCOUNTER — Ambulatory Visit: Payer: Self-pay | Admitting: Family Medicine

## 2024-08-13 DIAGNOSIS — R899 Unspecified abnormal finding in specimens from other organs, systems and tissues: Secondary | ICD-10-CM

## 2024-09-04 DIAGNOSIS — E119 Type 2 diabetes mellitus without complications: Secondary | ICD-10-CM | POA: Diagnosis not present

## 2024-09-07 ENCOUNTER — Encounter: Payer: Self-pay | Admitting: Cardiology

## 2024-09-07 ENCOUNTER — Ambulatory Visit: Payer: Self-pay | Attending: Cardiology | Admitting: Cardiology

## 2024-09-07 VITALS — BP 108/80 | HR 72 | Resp 17 | Ht 70.0 in | Wt 213.6 lb

## 2024-09-07 DIAGNOSIS — I2510 Atherosclerotic heart disease of native coronary artery without angina pectoris: Secondary | ICD-10-CM | POA: Diagnosis not present

## 2024-09-07 DIAGNOSIS — Z9861 Coronary angioplasty status: Secondary | ICD-10-CM | POA: Diagnosis not present

## 2024-09-07 DIAGNOSIS — E1169 Type 2 diabetes mellitus with other specified complication: Secondary | ICD-10-CM | POA: Diagnosis not present

## 2024-09-07 DIAGNOSIS — I252 Old myocardial infarction: Secondary | ICD-10-CM

## 2024-09-07 DIAGNOSIS — E785 Hyperlipidemia, unspecified: Secondary | ICD-10-CM

## 2024-09-07 DIAGNOSIS — I5042 Chronic combined systolic (congestive) and diastolic (congestive) heart failure: Secondary | ICD-10-CM

## 2024-09-07 NOTE — Assessment & Plan Note (Signed)
 Chronic combined systolic and diastolic heart failure => RESOLVED.  EF now back to baseline of greater than 55% following PCI and optimization of medical management..   No symptoms. Tolerates medications well. On max tolerated doses of carvedilol  (3.25 mg twice daily) and Entresto  24-26 mg twice daily along with Farxiga  10 mL daily. No diuretic requirement. - Maintain current medication regimen.

## 2024-09-07 NOTE — Assessment & Plan Note (Signed)
 Hyperlipidemia Cholesterol levels well-controlled on current statin dose.  LDL 52-improved from last recording of 62 indicating that he is doing well on the lower dose of statin. - Continue Lipitor  40 mg.  Type 2 diabetes mellitus Hemoglobin A1c is 6.2, indicating good control but not yet at target. - Continue Farxiga  and Glucophage . - Monitor blood glucose levels regularly.

## 2024-09-07 NOTE — Assessment & Plan Note (Signed)
 Doing well 3 years out from large anterior MI.  No recurrent angina or heart failure symptoms.Jeff Mccarthy recovery of LV pump function on repeat echo.  .Blood pressure and cholesterol levels controlled. Compliant with lifestyle and medication. - Continue Entresto  24-26 mg twice daily, carvedilol  3.125 mg twice daily, Lipitor  40 mg daily, Farxiga  10 mg daily, maintenance dose Brilinta  60 mg twice daily, and Glucophage  500 mg twice daily as needed. Okay to hold Brilinta  5 to 7 days preop for surgeries or procedures. 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

## 2024-09-07 NOTE — Assessment & Plan Note (Signed)
 3 years out from anterior STEMI with extensive LAD disease treated with 2 overlapping stents.  Pretty much full recovery of EF although there still remains some apical hypokinesis.  Recovery of cardiomyopathy with no active heart failure or angina symptoms.  Stable regimen.

## 2024-09-07 NOTE — Progress Notes (Signed)
 Cardiology Office Note:  .   Date:  09/07/2024  ID:  Jeff Mccarthy, DOB March 22, 1966, MRN [id number] PCP: Cook, Jayce G, DO  [facility] Providers Cardiologist:  Alm Clay, MD     Chief Complaint  Patient presents with   Follow-up    Annual follow-up.  Doing well.   Coronary Artery Disease    Has not had any chest pain or shortness of breath/palpitations since his MI.    Patient Profile: .     Jeff Mccarthy is a  58 y.o. male with a PMH anterior STEMI with LAD PCI and resultant ICM that resolved along with HTN, HLD and DM 2 who presents here for annual follow-up at the request of Bluford Jacqulyn MATSU, DO.  CV PMH: 08/09/2021: 1 V CAD; -> Anterior STEMI:  p LAD 30=> p-mLAD 60%=> mLAD (after D2) 100%-70% => 2 overlapping DES PCI Onyx Frontier prox-mid LAD (3.0 x 18 and 3.0 x 26-tapered postdilation 4.1-3.3 mm. Ischemic CM - resolved: => Post MI EF 35 to 40% with distal inferior, mid to distal inferoseptal and apical wall hypokinesis to akinesis. 10/2021: EF 55 to 60%.  Mid to apical septal and apical wall hypokinetic.  Septal hypertrophy.  GR 1 DD. CRF's: HTN, HLD and DM-2     Jeff Mccarthy was last seen on August 12, 2023 for 13-month follow-up doing quite well. He was about ready go well for a trip to [place] to celebrate his wife's birthday.  No issues with chest pain or pressure.  Tolerating his medications without difficulty.  Negative CV ROS.  Noted right shoulder discomfort and may need surgical repair.  Felt to be low risk for potential surgery as he was able to achieve greater than 8 METS. TC was 134 and LDL 62 which was an increase from the previous evaluation-likely related to reduced statin dose from 80 mg down to 40 mg atorvastatin .  A1c was 6.0.  Subjective  Discussed the use of AI scribe software for clinical note transcription with the patient, who gave verbal consent to proceed.  History of Present Illness Jeff Mccarthy is a 58 year old male who presents  for a cardiology evaluation regarding his ability to work. He was referred by a colleague at [facility] for a cardiologist's opinion regarding his ability to work.  He feels great with no new issues or changes in his health status. 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  He had a myocardial infarction previously and underwent a cardiac catheterization where two stents were placed due to a blockage. No chest pain, palpitations, heart racing, or pressure since the procedure. He states, 'I haven't had a bit of problem' since the intervention and expresses gratitude for the care he received.  He is currently on several medications including Entresto , carvedilol  3.125 mg twice a day, Lipitor  40 mg, Farxiga  10 mg, Glucophage  500 mg twice a day, Brilinta  twice a day, and Imitrex  as needed. He has no issues with these medications.  He has a history of diabetes, which was known prior to his cardiac event. His hemoglobin A1c is currently 6.2. He also has a history of kidney stones and notes a recent spike in urine proteins, for which he has an upcoming nephrology appointment.  He is a former smoker, having quit  approximately five years ago, and has made lifestyle changes including diet and exercise to improve his health. He travels frequently and plans to visit [place] in November.  Cardiovascular ROS: no chest pain or dyspnea on exertion negative for - edema, irregular heartbeat, loss of consciousness, orthopnea, palpitations, paroxysmal nocturnal dyspnea, rapid heart rate, shortness of breath, or near syncope, TIA/amaurosis fugax, claudication.  ROS:  Review of Systems - negative    Objective   Meds: Carvedilol  3.125 mg twice daily, Entresto  24 to 26 mg twice daily, Farxiga  10 mg daily, atorvastatin  40 midodrine  daily, Brilinta  60 mg twice daily, Glucophage  500 mg twice daily  Studies Reviewed: SABRA   EKG Interpretation Date/Time:  Thursday September 07 2024 13:49:02 EDT Ventricular Rate:  67 PR Interval:  164 QRS Duration:  76 QT Interval:  396 QTC Calculation: 418 R Axis:   -30  Text Interpretation: Normal sinus rhythm Left axis deviation Low voltage QRS Cannot rule out Anterior infarct (cited on or before 09-Aug-2021) When compared with ECG of 10-Aug-2021 06:50, Questionable change in initial forces of Septal leads Non-specific change in ST segment in Anterior leads SUSPECT LEAD V2 & 3 ARE REVERSED Confirmed by Anner Lenis ([id number]) on 09/07/2024 5:57:02 PM    Lab Results  Component Value Date   CHOL 122 08/09/2024   HDL 48 08/09/2024   LDLCALC 52 08/09/2024   TRIG 124 08/09/2024   CHOLHDL 2.5 08/09/2024   Lab Results  Component Value Date   NA 140 08/09/2024   K 4.5 08/09/2024   CREATININE 1.04 08/09/2024   EGFR 84 08/09/2024   GLUCOSE 128 (H) 08/09/2024   Lab Results  Component Value Date   HGBA1C 6.2 (H) 08/09/2024   ECHO: LVEF 55 to 60% with mid to apical septal and apical hypokinesis.  Severe septal hypertrophy.  G1 DD.  AoV calcification without stenosis.  Normal MV.  (EF improved from 35 to 40% and wall motion improved as well compared to previous Echo) (11/03/2021) Cardiac Cath-PCI: Large ectatic vessels proximal mid LAD 60% with 30% D2, mid LAD 100% just distal to D2 followed by 70% mid LAD (DES PCI-2 overlapping stents); otherwise minimal disease was notably 30% ostial RCA in a codominant system.  (08/09/2021) Diagnostic                                             Intervention: Overlapping Onyx Frontier DES 3 x 18 & 3 x 26 (tapered 4.1-3.23mm)     Risk Assessment/Calculations:             Physical Exam:   VS:  BP 108/80 (BP Location: Left Arm, Patient Position: Sitting, Cuff Size: Normal)   Pulse 72   Resp 17   Ht 5' 10 (1.778 m)   Wt 213 lb 9.6 oz (96.9 kg)   SpO2  99%   BMI 30.65 kg/m    Wt Readings from Last 3 Encounters:  09/07/24 213 lb 9.6 oz (96.9 kg)  08/09/24 213 lb (96.6 kg)  02/01/24 210 lb (95.3 kg)    GEN: Well nourished, well groomed;  in no acute distress; healthy appearing - mildy obese NECK: No JVD; No carotid bruits CARDIAC: Normal S1, S2; RRR, no murmurs, rubs, gallops RESPIRATORY:  Clear to auscultation without rales, wheezing or rhonchi ; nonlabored, good air movement. ABDOMEN: Soft, non-tender, non-distended EXTREMITIES:  No edema; No deformity  ASSESSMENT AND PLAN: .    Problem List Items Addressed This Visit       Cardiology Problems   CAD S/P DES PCI mid to distal LAD - Primary (Chronic)   Doing well 3 years out from large anterior MI.  No recurrent angina or heart failure symptoms.SABRA Richard recovery of LV pump function on repeat echo.  .Blood pressure and cholesterol levels controlled. Compliant with lifestyle and medication. - Continue Entresto  24-26 mg twice daily, carvedilol  3.125 mg twice daily, Lipitor  40 mg daily, Farxiga  10 mg daily, maintenance dose Brilinta  60 mg twice daily, and Glucophage  500 mg twice daily as needed. Okay to hold Brilinta  5 to 7 days preop for surgeries or procedures. 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      Chronic combined systolic and diastolic heart failure (HCC) -> essentially resolved (Chronic)   Chronic combined systolic and diastolic heart failure => RESOLVED.  EF now back to baseline of greater than 55% following PCI and optimization of medical management..   No symptoms. Tolerates medications well. On max tolerated doses of carvedilol  (3.25 mg twice daily) and Entresto  24-26 mg twice daily along with Farxiga  10 mL daily. No diuretic requirement. - Maintain current medication regimen.      Hyperlipidemia associated with type 2 diabetes mellitus (HCC) (Chronic)   Hyperlipidemia Cholesterol levels well-controlled on current  statin dose.  LDL 52-improved from last recording of 62 indicating that he is doing well on the lower dose of statin. - Continue Lipitor  40 mg.  Type 2 diabetes mellitus Hemoglobin A1c is 6.2, indicating good control but not yet at target. - Continue Farxiga  and Glucophage . - Monitor blood glucose levels regularly.        Other   History of anterior ST elevation myocardial infarction (STEMI) (Chronic)   3 years out from anterior STEMI with extensive LAD disease treated with 2 overlapping stents.  Pretty much full recovery of EF although there still remains some apical hypokinesis.  Recovery of cardiomyopathy with no active heart failure or angina symptoms.  Stable regimen.           Follow-Up: Return in about 1 year (around 09/07/2025) for Routine follow up with me, [place] office.      Signed, Alm MICAEL Clay, MD, MS Alm Clay, M.D., M.S. Interventional Cardiologist  [facility] Pager # [phone]

## 2024-09-07 NOTE — Patient Instructions (Signed)
 Medication Instructions:   Your physician recommends that you continue on your current medications as directed. Please refer to the Current Medication list given to you today.    *If you need a refill on your cardiac medications before your next appointment, please call your pharmacy*  Lab Work:  None ordered at this time   If you have labs (blood work) drawn today and your tests are completely normal, you will receive your results only by:  MyChart Message (if you have MyChart) OR  A paper copy in the mail If you have any lab test that is abnormal or we need to change your treatment, we will call you to review the results.  Testing/Procedures:  None ordered at this time   Referrals:  None ordered at this time   Follow-Up:  At [facility], you and your health needs are our priority.  As part of our continuing mission to provide you with exceptional heart care, our providers are all part of one team.  This team includes your primary Cardiologist (physician) and Advanced Practice Providers or APPs (Physician Assistants and Nurse Practitioners) who all work together to provide you with the care you need, when you need it.  Your next appointment:   1 year(s)  Provider:    Alm Clay, MD    We recommend signing up for the patient portal called MyChart.  Sign up information is provided on this After Visit Summary.  MyChart is used to connect with patients for Virtual Visits (Telemedicine).  Patients are able to view lab/test results, encounter notes, upcoming appointments, etc.  Non-urgent messages can be sent to your provider as well.   To learn more about what you can do with MyChart, go to [email or web address].

## 2024-09-08 ENCOUNTER — Other Ambulatory Visit (HOSPITAL_COMMUNITY): Payer: Self-pay

## 2024-09-08 ENCOUNTER — Telehealth: Payer: Self-pay | Admitting: Cardiology

## 2024-09-08 ENCOUNTER — Telehealth: Payer: Self-pay | Admitting: Pharmacy Technician

## 2024-09-08 NOTE — Telephone Encounter (Signed)
 Pharmacy Patient Advocate Encounter   Received notification from Onbase that prior authorization for Brilinta  60MG  tablets is required/requested.   Insurance verification completed.   The patient is insured through [facility].   Per test claim: PA form will be completed and faxed back once [facility] fax over the form

## 2024-09-08 NOTE — Telephone Encounter (Signed)
 Notes efaxed to number provided for [facility]

## 2024-09-08 NOTE — Telephone Encounter (Signed)
 Doris with Dr. Vicenta Cornet [facility] called in stating pt gave them a handwritten letter than Dr. Anner was okay with pt going back to full duty but he needs it in writing from the doctor or if pt OV from yesterday can be faxed over to them   Fax: [phone]

## 2024-09-11 ENCOUNTER — Other Ambulatory Visit (HOSPITAL_COMMUNITY): Payer: Self-pay

## 2024-09-11 NOTE — Telephone Encounter (Signed)
 [employer] perfers the generic ticagrelor . Rx written for brand name with a DAW1. Can therapy be changed to generic?

## 2024-09-12 NOTE — Telephone Encounter (Signed)
 Please send in a new prescription for the generic to patient's preferred pharmacy. It can not be dispensed for the generic with the current order on file. Thank you so much.  FYI we did receive a denial for the brand name and the denial letter has been attached to the patient's media tab.

## 2024-09-14 ENCOUNTER — Telehealth: Payer: Self-pay

## 2024-09-14 NOTE — Telephone Encounter (Signed)
 Copied from CRM [id number]. Topic: Clinical - Prescription Issue >> Sep 14, 2024 11:38 AM Avram MATSU wrote: Reason for CRM: rich is calling from [facility] stating a PA s required to get BRILINTA  60 MG TABS tablet [[id number]] brand name but he can continue to give the patient the generic depending on the providers choice.   Please advise [phone]

## 2024-09-19 DIAGNOSIS — R809 Proteinuria, unspecified: Secondary | ICD-10-CM | POA: Diagnosis not present

## 2024-09-19 DIAGNOSIS — N182 Chronic kidney disease, stage 2 (mild): Secondary | ICD-10-CM | POA: Diagnosis not present

## 2024-09-19 DIAGNOSIS — E1122 Type 2 diabetes mellitus with diabetic chronic kidney disease: Secondary | ICD-10-CM | POA: Diagnosis not present

## 2024-09-19 DIAGNOSIS — I5042 Chronic combined systolic (congestive) and diastolic (congestive) heart failure: Secondary | ICD-10-CM | POA: Diagnosis not present

## 2024-09-27 ENCOUNTER — Other Ambulatory Visit: Payer: Self-pay | Admitting: Family Medicine

## 2024-09-27 MED ORDER — TICAGRELOR 60 MG PO TABS: 60.0000 mg | ORAL_TABLET | Freq: Two times a day (BID) | ORAL | 3 refills | Status: AC

## 2024-09-27 NOTE — Telephone Encounter (Signed)
 Cook, Jayce G, DO      09/16/24  9:19 AM Generic is fine by me.

## 2024-09-28 NOTE — Telephone Encounter (Signed)
 Cook, Jayce G, [place]     09/27/24  8:43 PM Rx sent in.

## 2024-11-08 DIAGNOSIS — E785 Hyperlipidemia, unspecified: Secondary | ICD-10-CM | POA: Diagnosis not present

## 2024-11-08 DIAGNOSIS — E1122 Type 2 diabetes mellitus with diabetic chronic kidney disease: Secondary | ICD-10-CM | POA: Diagnosis not present

## 2024-11-08 DIAGNOSIS — R809 Proteinuria, unspecified: Secondary | ICD-10-CM | POA: Diagnosis not present

## 2024-11-08 DIAGNOSIS — I5042 Chronic combined systolic (congestive) and diastolic (congestive) heart failure: Secondary | ICD-10-CM | POA: Diagnosis not present

## 2024-11-08 DIAGNOSIS — N182 Chronic kidney disease, stage 2 (mild): Secondary | ICD-10-CM | POA: Diagnosis not present

## 2024-11-17 ENCOUNTER — Other Ambulatory Visit (HOSPITAL_COMMUNITY): Payer: Self-pay | Admitting: Nephrology

## 2024-11-17 DIAGNOSIS — R760 Raised antibody titer: Secondary | ICD-10-CM

## 2024-11-17 DIAGNOSIS — E559 Vitamin D deficiency, unspecified: Secondary | ICD-10-CM

## 2024-11-17 DIAGNOSIS — N200 Calculus of kidney: Secondary | ICD-10-CM

## 2024-11-17 DIAGNOSIS — N182 Chronic kidney disease, stage 2 (mild): Secondary | ICD-10-CM

## 2024-11-17 DIAGNOSIS — E1122 Type 2 diabetes mellitus with diabetic chronic kidney disease: Secondary | ICD-10-CM | POA: Diagnosis not present

## 2024-11-17 DIAGNOSIS — I5042 Chronic combined systolic (congestive) and diastolic (congestive) heart failure: Secondary | ICD-10-CM | POA: Diagnosis not present

## 2024-11-17 DIAGNOSIS — E785 Hyperlipidemia, unspecified: Secondary | ICD-10-CM

## 2024-11-17 DIAGNOSIS — R809 Proteinuria, unspecified: Secondary | ICD-10-CM

## 2024-11-17 DIAGNOSIS — E66811 Obesity, class 1: Secondary | ICD-10-CM

## 2024-11-22 ENCOUNTER — Ambulatory Visit (HOSPITAL_COMMUNITY)
Admission: RE | Admit: 2024-11-22 | Discharge: 2024-11-22 | Disposition: A | Discharge status: Home / Self Care | Source: Ambulatory Visit | Attending: Nephrology | Admitting: Nephrology

## 2024-11-22 DIAGNOSIS — E1122 Type 2 diabetes mellitus with diabetic chronic kidney disease: Secondary | ICD-10-CM | POA: Diagnosis not present

## 2024-11-22 DIAGNOSIS — E785 Hyperlipidemia, unspecified: Secondary | ICD-10-CM | POA: Insufficient documentation

## 2024-11-22 DIAGNOSIS — R760 Raised antibody titer: Secondary | ICD-10-CM | POA: Diagnosis not present

## 2024-11-22 DIAGNOSIS — E559 Vitamin D deficiency, unspecified: Secondary | ICD-10-CM | POA: Insufficient documentation

## 2024-11-22 DIAGNOSIS — I5042 Chronic combined systolic (congestive) and diastolic (congestive) heart failure: Secondary | ICD-10-CM | POA: Diagnosis not present

## 2024-11-22 DIAGNOSIS — N200 Calculus of kidney: Secondary | ICD-10-CM | POA: Insufficient documentation

## 2024-11-22 DIAGNOSIS — N182 Chronic kidney disease, stage 2 (mild): Secondary | ICD-10-CM | POA: Diagnosis not present

## 2024-11-22 DIAGNOSIS — E66811 Obesity, class 1: Secondary | ICD-10-CM | POA: Diagnosis not present

## 2024-11-22 DIAGNOSIS — R809 Proteinuria, unspecified: Secondary | ICD-10-CM | POA: Insufficient documentation

## 2024-12-06 ENCOUNTER — Other Ambulatory Visit: Payer: Self-pay | Admitting: Family Medicine

## 2025-02-06 ENCOUNTER — Ambulatory Visit: Admitting: Family Medicine
